# Patient Record
Sex: Female | Born: 1995 | Race: White | Hispanic: Yes | Marital: Single | Smoking: Never smoker
Health system: Southern US, Community
[De-identification: ages and names within clinical notes are randomized; demographics above are authoritative.]

## PROBLEM LIST (undated history)

## (undated) DIAGNOSIS — F419 Anxiety disorder, unspecified: Secondary | ICD-10-CM

## (undated) DIAGNOSIS — I739 Peripheral vascular disease, unspecified: Secondary | ICD-10-CM

## (undated) DIAGNOSIS — T8859XA Other complications of anesthesia, initial encounter: Secondary | ICD-10-CM

## (undated) DIAGNOSIS — F32A Depression, unspecified: Secondary | ICD-10-CM

## (undated) DIAGNOSIS — F329 Major depressive disorder, single episode, unspecified: Secondary | ICD-10-CM

## (undated) HISTORY — PX: CARDIAC CATHETERIZATION: SHX172

## (undated) HISTORY — PX: APPENDECTOMY: SHX54

## (undated) HISTORY — DX: Peripheral vascular disease, unspecified: I73.9

---

## 2015-07-11 ENCOUNTER — Other Ambulatory Visit: Payer: Self-pay | Admitting: Orthopedic Surgery

## 2015-07-11 DIAGNOSIS — M5432 Sciatica, left side: Principal | ICD-10-CM

## 2015-07-11 DIAGNOSIS — M5431 Sciatica, right side: Secondary | ICD-10-CM

## 2015-07-19 ENCOUNTER — Other Ambulatory Visit: Payer: Self-pay

## 2015-07-21 ENCOUNTER — Ambulatory Visit
Admission: RE | Admit: 2015-07-21 | Discharge: 2015-07-21 | Disposition: A | Discharge status: Home / Self Care | Payer: BLUE CROSS/BLUE SHIELD | Source: Ambulatory Visit | Attending: Orthopedic Surgery | Admitting: Orthopedic Surgery

## 2015-07-21 DIAGNOSIS — M5431 Sciatica, right side: Secondary | ICD-10-CM

## 2015-07-21 DIAGNOSIS — M5432 Sciatica, left side: Principal | ICD-10-CM

## 2015-08-26 DIAGNOSIS — S233XXA Sprain of ligaments of thoracic spine, initial encounter: Secondary | ICD-10-CM | POA: Diagnosis not present

## 2015-10-20 DIAGNOSIS — M545 Low back pain: Secondary | ICD-10-CM | POA: Diagnosis not present

## 2015-10-20 DIAGNOSIS — G479 Sleep disorder, unspecified: Secondary | ICD-10-CM | POA: Diagnosis not present

## 2015-10-26 DIAGNOSIS — G479 Sleep disorder, unspecified: Secondary | ICD-10-CM | POA: Diagnosis not present

## 2015-10-26 DIAGNOSIS — M545 Low back pain: Secondary | ICD-10-CM | POA: Diagnosis not present

## 2015-11-02 DIAGNOSIS — Z0000 Encounter for general adult medical examination without abnormal findings: Secondary | ICD-10-CM | POA: Diagnosis not present

## 2015-11-04 DIAGNOSIS — M545 Low back pain: Secondary | ICD-10-CM | POA: Diagnosis not present

## 2015-11-04 DIAGNOSIS — G479 Sleep disorder, unspecified: Secondary | ICD-10-CM | POA: Diagnosis not present

## 2015-11-09 DIAGNOSIS — M545 Low back pain: Secondary | ICD-10-CM | POA: Diagnosis not present

## 2015-11-09 DIAGNOSIS — G479 Sleep disorder, unspecified: Secondary | ICD-10-CM | POA: Diagnosis not present

## 2015-11-16 DIAGNOSIS — M545 Low back pain: Secondary | ICD-10-CM | POA: Diagnosis not present

## 2015-11-16 DIAGNOSIS — G479 Sleep disorder, unspecified: Secondary | ICD-10-CM | POA: Diagnosis not present

## 2015-11-22 DIAGNOSIS — G479 Sleep disorder, unspecified: Secondary | ICD-10-CM | POA: Diagnosis not present

## 2015-11-22 DIAGNOSIS — M545 Low back pain: Secondary | ICD-10-CM | POA: Diagnosis not present

## 2015-11-30 DIAGNOSIS — M545 Low back pain: Secondary | ICD-10-CM | POA: Diagnosis not present

## 2015-11-30 DIAGNOSIS — G479 Sleep disorder, unspecified: Secondary | ICD-10-CM | POA: Diagnosis not present

## 2015-12-05 DIAGNOSIS — G479 Sleep disorder, unspecified: Secondary | ICD-10-CM | POA: Diagnosis not present

## 2015-12-05 DIAGNOSIS — M545 Low back pain: Secondary | ICD-10-CM | POA: Diagnosis not present

## 2015-12-15 DIAGNOSIS — G479 Sleep disorder, unspecified: Secondary | ICD-10-CM | POA: Diagnosis not present

## 2015-12-15 DIAGNOSIS — M545 Low back pain: Secondary | ICD-10-CM | POA: Diagnosis not present

## 2015-12-29 DIAGNOSIS — M545 Low back pain: Secondary | ICD-10-CM | POA: Diagnosis not present

## 2015-12-29 DIAGNOSIS — G479 Sleep disorder, unspecified: Secondary | ICD-10-CM | POA: Diagnosis not present

## 2016-01-12 DIAGNOSIS — M545 Low back pain: Secondary | ICD-10-CM | POA: Diagnosis not present

## 2016-01-12 DIAGNOSIS — G479 Sleep disorder, unspecified: Secondary | ICD-10-CM | POA: Diagnosis not present

## 2016-01-26 DIAGNOSIS — M545 Low back pain: Secondary | ICD-10-CM | POA: Diagnosis not present

## 2016-01-26 DIAGNOSIS — G479 Sleep disorder, unspecified: Secondary | ICD-10-CM | POA: Diagnosis not present

## 2016-02-23 DIAGNOSIS — Z23 Encounter for immunization: Secondary | ICD-10-CM | POA: Diagnosis not present

## 2016-02-23 DIAGNOSIS — M549 Dorsalgia, unspecified: Secondary | ICD-10-CM | POA: Diagnosis not present

## 2016-02-29 DIAGNOSIS — M546 Pain in thoracic spine: Secondary | ICD-10-CM | POA: Diagnosis not present

## 2016-02-29 DIAGNOSIS — G8929 Other chronic pain: Secondary | ICD-10-CM | POA: Diagnosis not present

## 2016-05-01 DIAGNOSIS — F439 Reaction to severe stress, unspecified: Secondary | ICD-10-CM | POA: Diagnosis not present

## 2016-05-01 DIAGNOSIS — F430 Acute stress reaction: Secondary | ICD-10-CM | POA: Diagnosis not present

## 2016-05-01 DIAGNOSIS — F332 Major depressive disorder, recurrent severe without psychotic features: Secondary | ICD-10-CM | POA: Diagnosis not present

## 2016-05-08 DIAGNOSIS — F411 Generalized anxiety disorder: Secondary | ICD-10-CM | POA: Diagnosis not present

## 2016-05-08 DIAGNOSIS — G894 Chronic pain syndrome: Secondary | ICD-10-CM | POA: Diagnosis not present

## 2016-05-08 DIAGNOSIS — F332 Major depressive disorder, recurrent severe without psychotic features: Secondary | ICD-10-CM | POA: Diagnosis not present

## 2016-05-08 DIAGNOSIS — F4310 Post-traumatic stress disorder, unspecified: Secondary | ICD-10-CM | POA: Diagnosis not present

## 2016-05-15 DIAGNOSIS — F411 Generalized anxiety disorder: Secondary | ICD-10-CM | POA: Diagnosis not present

## 2016-05-15 DIAGNOSIS — F4310 Post-traumatic stress disorder, unspecified: Secondary | ICD-10-CM | POA: Diagnosis not present

## 2016-05-15 DIAGNOSIS — F332 Major depressive disorder, recurrent severe without psychotic features: Secondary | ICD-10-CM | POA: Diagnosis not present

## 2016-05-15 DIAGNOSIS — G894 Chronic pain syndrome: Secondary | ICD-10-CM | POA: Diagnosis not present

## 2016-05-18 DIAGNOSIS — G894 Chronic pain syndrome: Secondary | ICD-10-CM | POA: Diagnosis not present

## 2016-05-18 DIAGNOSIS — F411 Generalized anxiety disorder: Secondary | ICD-10-CM | POA: Diagnosis not present

## 2016-05-18 DIAGNOSIS — R112 Nausea with vomiting, unspecified: Secondary | ICD-10-CM | POA: Diagnosis not present

## 2016-05-18 DIAGNOSIS — F331 Major depressive disorder, recurrent, moderate: Secondary | ICD-10-CM | POA: Diagnosis not present

## 2016-05-18 DIAGNOSIS — R109 Unspecified abdominal pain: Secondary | ICD-10-CM | POA: Diagnosis not present

## 2016-05-19 ENCOUNTER — Encounter (HOSPITAL_COMMUNITY): Payer: Self-pay | Admitting: Emergency Medicine

## 2016-05-19 ENCOUNTER — Encounter (HOSPITAL_COMMUNITY): Admission: EM | Disposition: A | Discharge status: Home / Self Care | Payer: Self-pay | Source: Home / Self Care

## 2016-05-19 ENCOUNTER — Emergency Department (HOSPITAL_COMMUNITY): Payer: BLUE CROSS/BLUE SHIELD | Admitting: Anesthesiology

## 2016-05-19 ENCOUNTER — Observation Stay (HOSPITAL_COMMUNITY)
Admission: EM | Admit: 2016-05-19 | Discharge: 2016-05-21 | Disposition: A | Discharge status: Home / Self Care | Payer: BLUE CROSS/BLUE SHIELD | Attending: Surgery | Admitting: Surgery

## 2016-05-19 ENCOUNTER — Emergency Department (HOSPITAL_COMMUNITY): Payer: BLUE CROSS/BLUE SHIELD

## 2016-05-19 DIAGNOSIS — R1011 Right upper quadrant pain: Secondary | ICD-10-CM | POA: Diagnosis not present

## 2016-05-19 DIAGNOSIS — F419 Anxiety disorder, unspecified: Secondary | ICD-10-CM | POA: Diagnosis not present

## 2016-05-19 DIAGNOSIS — K353 Acute appendicitis with localized peritonitis: Secondary | ICD-10-CM | POA: Diagnosis not present

## 2016-05-19 DIAGNOSIS — F329 Major depressive disorder, single episode, unspecified: Secondary | ICD-10-CM | POA: Diagnosis not present

## 2016-05-19 DIAGNOSIS — R109 Unspecified abdominal pain: Secondary | ICD-10-CM | POA: Diagnosis not present

## 2016-05-19 DIAGNOSIS — K3580 Unspecified acute appendicitis: Secondary | ICD-10-CM | POA: Diagnosis not present

## 2016-05-19 HISTORY — DX: Depression, unspecified: F32.A

## 2016-05-19 HISTORY — PX: LAPAROSCOPIC APPENDECTOMY: SHX408

## 2016-05-19 HISTORY — DX: Major depressive disorder, single episode, unspecified: F32.9

## 2016-05-19 HISTORY — DX: Anxiety disorder, unspecified: F41.9

## 2016-05-19 LAB — COMPREHENSIVE METABOLIC PANEL
ALT: 12 U/L — ABNORMAL LOW (ref 14–54)
AST: 26 U/L (ref 15–41)
Albumin: 4.4 g/dL (ref 3.5–5.0)
Alkaline Phosphatase: 48 U/L (ref 38–126)
Anion gap: 11 (ref 5–15)
BUN: 10 mg/dL (ref 6–20)
CO2: 21 mmol/L — ABNORMAL LOW (ref 22–32)
Calcium: 9.3 mg/dL (ref 8.9–10.3)
Chloride: 102 mmol/L (ref 101–111)
Creatinine, Ser: 0.7 mg/dL (ref 0.44–1.00)
GFR calc Af Amer: >60 mL/min (ref >60)
GFR calc non Af Amer: >60 mL/min (ref >60)
Glucose, Bld: 177 mg/dL — ABNORMAL HIGH (ref 65–99)
Potassium: 3.7 mmol/L (ref 3.5–5.1)
Sodium: 134 mmol/L — ABNORMAL LOW (ref 135–145)
Total Bilirubin: 1 mg/dL (ref 0.3–1.2)
Total Protein: 7.2 g/dL (ref 6.5–8.1)

## 2016-05-19 LAB — CBC
HCT: 38.6 % (ref 36.0–46.0)
Hemoglobin: 12.9 g/dL (ref 12.0–15.0)
MCH: 28.5 pg (ref 26.0–34.0)
MCHC: 33.4 g/dL (ref 30.0–36.0)
MCV: 85.4 fL (ref 78.0–100.0)
Platelets: 247 10*3/uL (ref 150–400)
RBC: 4.52 MIL/uL (ref 3.87–5.11)
RDW: 12.8 % (ref 11.5–15.5)
WBC: 20.3 10*3/uL — ABNORMAL HIGH (ref 4.0–10.5)

## 2016-05-19 LAB — URINALYSIS, ROUTINE W REFLEX MICROSCOPIC
Bilirubin Urine: NEGATIVE
Glucose, UA: 50 mg/dL — AB
Hgb urine dipstick: NEGATIVE
Ketones, ur: 80 mg/dL — AB
Nitrite: NEGATIVE
Protein, ur: 30 mg/dL — AB
Specific Gravity, Urine: 1.028 (ref 1.005–1.030)
pH: 6 (ref 5.0–8.0)

## 2016-05-19 LAB — I-STAT BETA HCG BLOOD, ED (MC, WL, AP ONLY): I-stat hCG, quantitative: <5 m[IU]/mL (ref <5)

## 2016-05-19 LAB — LIPASE, BLOOD: Lipase: 21 U/L (ref 11–51)

## 2016-05-19 SURGERY — APPENDECTOMY, LAPAROSCOPIC
Anesthesia: General | Site: Abdomen

## 2016-05-19 MED ORDER — ONDANSETRON HCL 4 MG/2ML IJ SOLN
INTRAMUSCULAR | Status: DC | PRN
  Administered 2016-05-19: 4 mg via INTRAVENOUS

## 2016-05-19 MED ORDER — KCL IN DEXTROSE-NACL 20-5-0.9 MEQ/L-%-% IV SOLN
INTRAVENOUS | Status: DC
  Administered 2016-05-19 – 2016-05-20 (×2): via INTRAVENOUS
  Filled 2016-05-19 (×4): qty 1000

## 2016-05-19 MED ORDER — PIPERACILLIN-TAZOBACTAM 3.375 G IVPB: 3.3750 g | Freq: Three times a day (TID) | INTRAVENOUS | Status: DC

## 2016-05-19 MED ORDER — METHOCARBAMOL 500 MG PO TABS
500.0000 mg | ORAL_TABLET | Freq: Four times a day (QID) | ORAL | Status: DC | PRN
  Administered 2016-05-20 – 2016-05-21 (×4): 500 mg via ORAL
  Filled 2016-05-19 (×4): qty 1

## 2016-05-19 MED ORDER — 0.9 % SODIUM CHLORIDE (POUR BTL) OPTIME
TOPICAL | Status: DC | PRN
  Administered 2016-05-19: 1000 mL

## 2016-05-19 MED ORDER — SIMETHICONE 80 MG PO CHEW: 40.0000 mg | CHEWABLE_TABLET | Freq: Four times a day (QID) | ORAL | Status: DC | PRN

## 2016-05-19 MED ORDER — LABETALOL HCL 5 MG/ML IV SOLN
INTRAVENOUS | Status: AC
  Filled 2016-05-19: qty 4

## 2016-05-19 MED ORDER — SUCCINYLCHOLINE CHLORIDE 20 MG/ML IJ SOLN
INTRAMUSCULAR | Status: DC | PRN
  Administered 2016-05-19: 100 mg via INTRAVENOUS

## 2016-05-19 MED ORDER — BUPIVACAINE HCL (PF) 0.25 % IJ SOLN
INTRAMUSCULAR | Status: DC | PRN
  Administered 2016-05-19: 5 mL

## 2016-05-19 MED ORDER — LACTATED RINGERS IV SOLN
INTRAVENOUS | Status: DC | PRN
  Administered 2016-05-19 (×2): via INTRAVENOUS

## 2016-05-19 MED ORDER — LIDOCAINE HCL (CARDIAC) 20 MG/ML IV SOLN
INTRAVENOUS | Status: DC | PRN
  Administered 2016-05-19: 60 mg via INTRATRACHEAL

## 2016-05-19 MED ORDER — PROPOFOL 10 MG/ML IV BOLUS
INTRAVENOUS | Status: AC
  Filled 2016-05-19: qty 40

## 2016-05-19 MED ORDER — KCL IN DEXTROSE-NACL 20-5-0.9 MEQ/L-%-% IV SOLN: INTRAVENOUS | Status: DC

## 2016-05-19 MED ORDER — GABAPENTIN 300 MG PO CAPS
300.0000 mg | ORAL_CAPSULE | Freq: Three times a day (TID) | ORAL | Status: DC
  Administered 2016-05-19 – 2016-05-21 (×5): 300 mg via ORAL
  Filled 2016-05-19 (×5): qty 1

## 2016-05-19 MED ORDER — MORPHINE SULFATE (PF) 4 MG/ML IV SOLN
6.0000 mg | Freq: Once | INTRAVENOUS | Status: AC
  Administered 2016-05-19: 6 mg via INTRAVENOUS
  Filled 2016-05-19: qty 2

## 2016-05-19 MED ORDER — PROMETHAZINE HCL 25 MG/ML IJ SOLN: 6.2500 mg | INTRAMUSCULAR | Status: DC | PRN

## 2016-05-19 MED ORDER — ONDANSETRON HCL 4 MG/2ML IJ SOLN: 4.0000 mg | Freq: Four times a day (QID) | INTRAMUSCULAR | Status: DC | PRN

## 2016-05-19 MED ORDER — MEPERIDINE HCL 25 MG/ML IJ SOLN: 6.2500 mg | INTRAMUSCULAR | Status: DC | PRN

## 2016-05-19 MED ORDER — LACTATED RINGERS IV SOLN: INTRAVENOUS | Status: DC

## 2016-05-19 MED ORDER — MIRTAZAPINE 7.5 MG PO TABS
7.5000 mg | ORAL_TABLET | Freq: Every day | ORAL | Status: DC
  Administered 2016-05-19 – 2016-05-20 (×2): 7.5 mg via ORAL
  Filled 2016-05-19 (×3): qty 1

## 2016-05-19 MED ORDER — CHLORHEXIDINE GLUCONATE CLOTH 2 % EX PADS: 6.0000 | MEDICATED_PAD | Freq: Once | CUTANEOUS | Status: DC

## 2016-05-19 MED ORDER — ZOLPIDEM TARTRATE 5 MG PO TABS: 5.0000 mg | ORAL_TABLET | Freq: Every evening | ORAL | Status: DC | PRN

## 2016-05-19 MED ORDER — HYDROMORPHONE HCL 1 MG/ML IJ SOLN
0.2500 mg | INTRAMUSCULAR | Status: DC | PRN
  Administered 2016-05-19: 0.5 mg via INTRAVENOUS

## 2016-05-19 MED ORDER — FENTANYL CITRATE (PF) 250 MCG/5ML IJ SOLN
INTRAMUSCULAR | Status: DC | PRN
  Administered 2016-05-19: 50 ug via INTRAVENOUS
  Administered 2016-05-19: 100 ug via INTRAVENOUS
  Administered 2016-05-19: 50 ug via INTRAVENOUS

## 2016-05-19 MED ORDER — PROPOFOL 10 MG/ML IV BOLUS
INTRAVENOUS | Status: DC | PRN
Start: 2016-05-19 — End: 2016-05-19
  Administered 2016-05-19: 150 mg via INTRAVENOUS

## 2016-05-19 MED ORDER — MORPHINE SULFATE (PF) 2 MG/ML IV SOLN: 1.0000 mg | INTRAVENOUS | Status: DC | PRN

## 2016-05-19 MED ORDER — ONDANSETRON 4 MG PO TBDP: 4.0000 mg | ORAL_TABLET | Freq: Four times a day (QID) | ORAL | Status: DC | PRN

## 2016-05-19 MED ORDER — CEFAZOLIN SODIUM 1 G IJ SOLR
INTRAMUSCULAR | Status: AC
  Filled 2016-05-19: qty 20

## 2016-05-19 MED ORDER — ONDANSETRON HCL 4 MG/2ML IJ SOLN: 4.0000 mg | Freq: Once | INTRAMUSCULAR | Status: DC

## 2016-05-19 MED ORDER — ONDANSETRON HCL 4 MG/2ML IJ SOLN
4.0000 mg | Freq: Once | INTRAMUSCULAR | Status: AC
  Administered 2016-05-19: 4 mg via INTRAVENOUS
  Filled 2016-05-19: qty 2

## 2016-05-19 MED ORDER — SODIUM CHLORIDE 0.9 % IR SOLN
Status: DC | PRN
  Administered 2016-05-19: 1000 mL

## 2016-05-19 MED ORDER — ROCURONIUM BROMIDE 100 MG/10ML IV SOLN
INTRAVENOUS | Status: DC | PRN
  Administered 2016-05-19: 30 mg via INTRAVENOUS

## 2016-05-19 MED ORDER — MIDAZOLAM HCL 5 MG/5ML IJ SOLN
INTRAMUSCULAR | Status: DC | PRN
Start: 2016-05-19 — End: 2016-05-19
  Administered 2016-05-19: 2 mg via INTRAVENOUS

## 2016-05-19 MED ORDER — OXYCODONE HCL 5 MG PO TABS: 5.0000 mg | ORAL_TABLET | ORAL | Status: DC | PRN

## 2016-05-19 MED ORDER — GLYCOPYRROLATE 0.2 MG/ML IJ SOLN
INTRAMUSCULAR | Status: DC | PRN
  Administered 2016-05-19: .6 mg via INTRAVENOUS

## 2016-05-19 MED ORDER — ENOXAPARIN SODIUM 40 MG/0.4ML ~~LOC~~ SOLN: 40.0000 mg | SUBCUTANEOUS | Status: DC

## 2016-05-19 MED ORDER — CEFAZOLIN SODIUM-DEXTROSE 2-3 GM-% IV SOLR
INTRAVENOUS | Status: DC | PRN
  Administered 2016-05-19: 2 g via INTRAVENOUS

## 2016-05-19 MED ORDER — LACTATED RINGERS IV SOLN
INTRAVENOUS | Status: DC
Start: 2016-05-19 — End: 2016-05-19
  Administered 2016-05-19: 18:00:00 via INTRAVENOUS

## 2016-05-19 MED ORDER — SODIUM CHLORIDE 0.9 % IV BOLUS (SEPSIS)
1000.0000 mL | Freq: Once | INTRAVENOUS | Status: AC
  Administered 2016-05-19: 1000 mL via INTRAVENOUS

## 2016-05-19 MED ORDER — MORPHINE SULFATE (PF) 2 MG/ML IV SOLN
1.0000 mg | INTRAVENOUS | Status: DC | PRN
  Administered 2016-05-20: 1 mg via INTRAVENOUS
  Filled 2016-05-19: qty 1

## 2016-05-19 MED ORDER — BUPIVACAINE HCL (PF) 0.25 % IJ SOLN
INTRAMUSCULAR | Status: AC
  Filled 2016-05-19: qty 30

## 2016-05-19 MED ORDER — MENTHOL 3 MG MT LOZG
1.0000 | LOZENGE | OROMUCOSAL | Status: DC | PRN
  Filled 2016-05-19: qty 9

## 2016-05-19 MED ORDER — MIDAZOLAM HCL 2 MG/2ML IJ SOLN
INTRAMUSCULAR | Status: AC
  Filled 2016-05-19: qty 2

## 2016-05-19 MED ORDER — CEFTRIAXONE SODIUM 1 G IJ SOLR
1.0000 g | Freq: Once | INTRAMUSCULAR | Status: AC
  Administered 2016-05-19: 1 g via INTRAVENOUS
  Filled 2016-05-19: qty 10

## 2016-05-19 MED ORDER — DEXTROSE 5 % IV SOLN
3.0000 g | INTRAVENOUS | Status: DC
  Filled 2016-05-19: qty 3000

## 2016-05-19 MED ORDER — FENTANYL CITRATE (PF) 100 MCG/2ML IJ SOLN
INTRAMUSCULAR | Status: AC
  Filled 2016-05-19: qty 4

## 2016-05-19 MED ORDER — NEOSTIGMINE METHYLSULFATE 10 MG/10ML IV SOLN
INTRAVENOUS | Status: DC | PRN
  Administered 2016-05-19: 3 mg via INTRAVENOUS

## 2016-05-19 MED ORDER — PHENOL 1.4 % MT LIQD
1.0000 | OROMUCOSAL | Status: DC | PRN
  Filled 2016-05-19: qty 177

## 2016-05-19 MED ORDER — HYDROMORPHONE HCL 1 MG/ML IJ SOLN
INTRAMUSCULAR | Status: AC
  Filled 2016-05-19: qty 0.5

## 2016-05-19 MED ORDER — OXYCODONE HCL 5 MG PO TABS
5.0000 mg | ORAL_TABLET | ORAL | Status: DC | PRN
  Administered 2016-05-20 (×4): 10 mg via ORAL
  Administered 2016-05-21: 5 mg via ORAL
  Filled 2016-05-19 (×2): qty 2
  Filled 2016-05-19: qty 1
  Filled 2016-05-19 (×2): qty 2

## 2016-05-19 MED ORDER — ESMOLOL HCL 100 MG/10ML IV SOLN
INTRAVENOUS | Status: AC
  Filled 2016-05-19: qty 10

## 2016-05-19 MED ORDER — IOPAMIDOL (ISOVUE-300) INJECTION 61%
INTRAVENOUS | Status: AC
  Administered 2016-05-19: 80 mL
  Filled 2016-05-19: qty 100

## 2016-05-19 SURGICAL SUPPLY — 41 items
APPLIER CLIP ROT 10 11.4 M/L (STAPLE)
BLADE SURG ROTATE 9660 (MISCELLANEOUS) ×2 IMPLANT
CANISTER SUCTION 2500CC (MISCELLANEOUS) ×2 IMPLANT
CHLORAPREP W/TINT 26ML (MISCELLANEOUS) ×2 IMPLANT
CLIP APPLIE ROT 10 11.4 M/L (STAPLE) IMPLANT
COVER SURGICAL LIGHT HANDLE (MISCELLANEOUS) ×2 IMPLANT
CUTTER FLEX LINEAR 45M (STAPLE) ×2 IMPLANT
DERMABOND ADHESIVE PROPEN (GAUZE/BANDAGES/DRESSINGS) ×1
DERMABOND ADVANCED (GAUZE/BANDAGES/DRESSINGS) ×1
DERMABOND ADVANCED .7 DNX12 (GAUZE/BANDAGES/DRESSINGS) ×1 IMPLANT
DERMABOND ADVANCED .7 DNX6 (GAUZE/BANDAGES/DRESSINGS) ×1 IMPLANT
DRAPE WARM FLUID 44X44 (DRAPE) ×2 IMPLANT
ELECT REM PT RETURN 9FT ADLT (ELECTROSURGICAL) ×2
ELECTRODE REM PT RTRN 9FT ADLT (ELECTROSURGICAL) ×1 IMPLANT
ENDOLOOP SUT PDS II  0 18 (SUTURE)
ENDOLOOP SUT PDS II 0 18 (SUTURE) IMPLANT
GLOVE BIO SURGEON STRL SZ8 (GLOVE) ×2 IMPLANT
GLOVE BIOGEL PI IND STRL 8 (GLOVE) ×1 IMPLANT
GLOVE BIOGEL PI INDICATOR 8 (GLOVE) ×1
GOWN STRL REUS W/ TWL LRG LVL3 (GOWN DISPOSABLE) ×2 IMPLANT
GOWN STRL REUS W/ TWL XL LVL3 (GOWN DISPOSABLE) ×1 IMPLANT
GOWN STRL REUS W/TWL LRG LVL3 (GOWN DISPOSABLE) ×2
GOWN STRL REUS W/TWL XL LVL3 (GOWN DISPOSABLE) ×1
KIT BASIN OR (CUSTOM PROCEDURE TRAY) ×2 IMPLANT
KIT ROOM TURNOVER OR (KITS) ×2 IMPLANT
NS IRRIG 1000ML POUR BTL (IV SOLUTION) ×2 IMPLANT
PAD ARMBOARD 7.5X6 YLW CONV (MISCELLANEOUS) ×4 IMPLANT
POUCH SPECIMEN RETRIEVAL 10MM (ENDOMECHANICALS) ×2 IMPLANT
RELOAD STAPLE TA45 3.5 REG BLU (ENDOMECHANICALS) ×2 IMPLANT
SCALPEL HARMONIC ACE (MISCELLANEOUS) ×2 IMPLANT
SCISSORS LAP 5X35 DISP (ENDOMECHANICALS) ×2 IMPLANT
SET IRRIG TUBING LAPAROSCOPIC (IRRIGATION / IRRIGATOR) ×2 IMPLANT
SPECIMEN JAR SMALL (MISCELLANEOUS) ×2 IMPLANT
SUT MON AB 4-0 PC3 18 (SUTURE) ×2 IMPLANT
TOWEL OR 17X24 6PK STRL BLUE (TOWEL DISPOSABLE) ×2 IMPLANT
TOWEL OR 17X26 10 PK STRL BLUE (TOWEL DISPOSABLE) ×2 IMPLANT
TRAY FOLEY BAG SILVER LF 14FR (SET/KITS/TRAYS/PACK) ×2 IMPLANT
TRAY LAPAROSCOPIC MC (CUSTOM PROCEDURE TRAY) ×2 IMPLANT
TROCAR XCEL BLADELESS 5X75MML (TROCAR) ×6 IMPLANT
TROCAR XCEL BLUNT TIP 100MML (ENDOMECHANICALS) ×4 IMPLANT
TUBING INSUFFLATION (TUBING) ×2 IMPLANT

## 2016-05-19 NOTE — ED Notes (Signed)
Out of bed to bathroom with steady gait.

## 2016-05-19 NOTE — Anesthesia Preprocedure Evaluation (Addendum)
Anesthesia Evaluation  Patient identified by MRN, date of birth, ID band Patient awake    Reviewed: Allergy & Precautions, NPO status , Patient's Chart, lab work & pertinent test results  Airway Mallampati: I  TM Distance: <3 FB Neck ROM: Full    Dental  (+) Teeth Intact, Dental Advisory Given   Pulmonary    breath sounds clear to auscultation       Cardiovascular negative cardio ROS   Rhythm:Regular Rate:Normal     Neuro/Psych PSYCHIATRIC DISORDERS Anxiety Depression negative neurological ROS     GI/Hepatic negative GI ROS, Neg liver ROS,   Endo/Other  negative endocrine ROS  Renal/GU negative Renal ROS  negative genitourinary   Musculoskeletal negative musculoskeletal ROS (+)   Abdominal   Peds negative pediatric ROS (+)  Hematology negative hematology ROS (+)   Anesthesia Other Findings   Reproductive/Obstetrics negative OB ROS                           Anesthesia Physical Anesthesia Plan  ASA: II and emergent  Anesthesia Plan: General   Post-op Pain Management:    Induction: Intravenous  Airway Management Planned: Oral ETT  Additional Equipment:   Intra-op Plan:   Post-operative Plan: Extubation in OR  Informed Consent: I have reviewed the patients History and Physical, chart, labs and discussed the procedure including the risks, benefits and alternatives for the proposed anesthesia with the patient or authorized representative who has indicated his/her understanding and acceptance.   Dental advisory given  Plan Discussed with: CRNA, Anesthesiologist and Surgeon  Anesthesia Plan Comments:        Anesthesia Quick Evaluation

## 2016-05-19 NOTE — Interval H&P Note (Signed)
History and Physical Interval Note:  05/19/2016 6:21 PM  Candice BeetsLizbeth Fiero  has presented today for surgery, with the diagnosis of acute appendicitis  The various methods of treatment have been discussed with the patient and family. After consideration of risks, benefits and other options for treatment, the patient has consented to  Procedure(s): APPENDECTOMY LAPAROSCOPIC (N/A) as a surgical intervention .  The patient's history has been reviewed, patient examined, no change in status, stable for surgery.  I have reviewed the patient's chart and labs.  Questions were answered to the patient's satisfaction.     Derry Arbogast A.

## 2016-05-19 NOTE — Op Note (Signed)
Appendectomy, Lap, Procedure Note  Indications: The patient presented with a history of right-sided abdominal pain. A CT  revealed findings consistent with acute appendicitis.The procedure has been discussed with the patient.  Alternative therapies have been discussed with the patient.  Operative risks include bleeding,  Infection,  Organ injury,  Nerve injury,  Blood vessel injury,  DVT,  Pulmonary embolism, abscess open surgery  Death,  And possible reoperation.  Medical management risks include worsening of present situation.  The success of the procedure is 50 -95 % at treating patients symptoms.  The patient understands and agrees to proceed.  Pre-operative Diagnosis: Acute appendicitis without mention of peritonitis  Post-operative Diagnosis: Same  Surgeon: Gaylon Bentz A.   Assistants: none  Anesthesia: General endotracheal anesthesia and Local anesthesia 0.25.% bupivacaine, with epinephrine  ASA Class: 1  Procedure Details  The patient was seen again in the Holding Room. The risks, benefits, complications, treatment options, and expected outcomes were discussed with the patient and/or family. The possibilities of reaction to medication, pulmonary aspiration, perforation of viscus, bleeding, recurrent infection, finding a normal appendix, the need for additional procedures, failure to diagnose a condition, and creating a complication requiring transfusion or operation were discussed. There was concurrence with the proposed plan and informed consent was obtained. The site of surgery was properly noted/marked. The patient was taken to Operating Room, identified as Candice Cruz and the procedure verified as Appendectomy. A Time Out was held and the above information confirmed.  The patient was placed in the supine position and general anesthesia was induced, along with placement of orogastric tube, Venodyne boots, and a Foley catheter. The abdomen was prepped and draped in a sterile  fashion. A one centimeter infraumbilical incision was made and the peritoneal cavity was accessed using the OPEN  technique. The pneumoperitoneum was then established to steady pressure of 12 mmHg. A 12 mm port was placed through the umbilical incision. Additional 5 mm cannulas then placed in the lower midline of the abdomen and half way between the umbilicus and xyphoid under direct vision. A careful evaluation of the entire abdomen was carried out. The patient was placed in Trendelenburg and left lateral decubitus position. The small intestines were retracted in the cephalad and left lateral direction away from the pelvis and right lower quadrant. The patient was found to have an enlarged and inflamed appendix that was extending into the pelvis. There was no evidence of perforation.  The appendix was carefully dissected. A window was made in the mesoappendix at the base of the appendix. A harmonic scalpel was used across the mesoappendix. The appendix was divided at its base using an endo-GIA stapler. Minimal appendiceal stump was left in place. There was no evidence of bleeding, leakage, or complication after division of the appendix. Irrigation was also performed and irrigate suctioned from the abdomen as well.  The umbilical port site was closed using 0 vicryl pursestring sutures fashion at the level of the fascia. The trocar site skin wounds were closed using skin staples.  Instrument, sponge, and needle counts were correct at the conclusion of the case.   Findings: The appendix was found to be inflamed. There were not signs of necrosis.  There was not perforation. There was not abscess formation.  Estimated Blood Loss:  Minimal         Drains: none         Total IV Fluids: 800 mL         Specimens: appendix  Complications:  None; patient tolerated the procedure well.         Disposition: PACU - hemodynamically stable.         Condition: stable

## 2016-05-19 NOTE — Progress Notes (Signed)
Report given to Rebecca, CRNA 

## 2016-05-19 NOTE — ED Notes (Signed)
Admitting MD at bedside.

## 2016-05-19 NOTE — Transfer of Care (Incomplete)
Immediate Anesthesia Transfer of Care Note  Patient: Candice BeetsLizbeth Peckenpaugh  Procedure(s) Performed: Procedure(s): APPENDECTOMY LAPAROSCOPIC (N/A)  Patient Location: {PLACES; ANE POST:19477::"PACU"}  Anesthesia Type:{PROCEDURES; ANE POST ANESTHESIA TYPE:19480}  Level of Consciousness: {FINDINGS; ANE POST LEVEL OF CONSCIOUSNESS:19484}  Airway & Oxygen Therapy: {Exam; oxygen device:30095}  Post-op Assessment: {ASSESSMENT;POST-OP ZOXWRU:04540}REPORT:18741}  Post vital signs: {DESC; ANE POST JWJXBJ:47829}VITALS:19483}  Last Vitals:  Vitals:   05/19/16 1730 05/19/16 1748  BP: 93/55 118/61  Pulse: 73 91  Resp:  16  Temp:  37 C    Last Pain:  Vitals:   05/19/16 1748  TempSrc: Oral  PainSc: 2          Complications: {FINDINGS; ANE POST COMPLICATIONS:19485}

## 2016-05-19 NOTE — H&P (Signed)
Candice Cruz is an 21 y.o. female.   Chief Complaint: abdominal pain HPI: asked to see patient in the ED for 1 day hx of RLQ abdominal pain.  Pain has worsened since yesterday and is located RLQ abdomen.  It is sharp and hurts more when she moves.  CT shows acute appendicitis.    Past Medical History:  Diagnosis Date  . Anxiety   . Depression     History reviewed. No pertinent surgical history.  No family history on file. Social History:  reports that she has never smoked. She has never used smokeless tobacco. She reports that she does not drink alcohol or use drugs.  Allergies:  Allergies  Allergen Reactions  . Cymbalta [Duloxetine Hcl]   . Duloxetine Other (See Comments)    Panic attacks     (Not in a hospital admission)  Results for orders placed or performed during the hospital encounter of 05/19/16 (from the past 48 hour(s))  Urinalysis, Routine w reflex microscopic     Status: Abnormal   Collection Time: 05/19/16  1:21 AM  Result Value Ref Range   Color, Urine YELLOW YELLOW   APPearance HAZY (A) CLEAR   Specific Gravity, Urine 1.028 1.005 - 1.030   pH 6.0 5.0 - 8.0   Glucose, UA 50 (A) NEGATIVE mg/dL   Hgb urine dipstick NEGATIVE NEGATIVE   Bilirubin Urine NEGATIVE NEGATIVE   Ketones, ur 80 (A) NEGATIVE mg/dL   Protein, ur 30 (A) NEGATIVE mg/dL   Nitrite NEGATIVE NEGATIVE   Leukocytes, UA LARGE (A) NEGATIVE   RBC / HPF 0-5 0 - 5 RBC/hpf   WBC, UA 6-30 0 - 5 WBC/hpf   Bacteria, UA RARE (A) NONE SEEN   Squamous Epithelial / LPF 0-5 (A) NONE SEEN   Mucous PRESENT   Lipase, blood     Status: None   Collection Time: 05/19/16  1:22 AM  Result Value Ref Range   Lipase 21 11 - 51 U/L  Comprehensive metabolic panel     Status: Abnormal   Collection Time: 05/19/16  1:22 AM  Result Value Ref Range   Sodium 134 (L) 135 - 145 mmol/L   Potassium 3.7 3.5 - 5.1 mmol/L   Chloride 102 101 - 111 mmol/L   CO2 21 (L) 22 - 32 mmol/L   Glucose, Bld 177 (H) 65 - 99 mg/dL   BUN 10 6 - 20 mg/dL   Creatinine, Ser 0.70 0.44 - 1.00 mg/dL   Calcium 9.3 8.9 - 10.3 mg/dL   Total Protein 7.2 6.5 - 8.1 g/dL   Albumin 4.4 3.5 - 5.0 g/dL   AST 26 15 - 41 U/L   ALT 12 (L) 14 - 54 U/L   Alkaline Phosphatase 48 38 - 126 U/L   Total Bilirubin 1.0 0.3 - 1.2 mg/dL   GFR calc non Af Amer >60 >60 mL/min   GFR calc Af Amer >60 >60 mL/min    Comment: (NOTE) The eGFR has been calculated using the CKD EPI equation. This calculation has not been validated in all clinical situations. eGFR's persistently <60 mL/min signify possible Chronic Kidney Disease.    Anion gap 11 5 - 15  CBC     Status: Abnormal   Collection Time: 05/19/16  1:22 AM  Result Value Ref Range   WBC 20.3 (H) 4.0 - 10.5 K/uL   RBC 4.52 3.87 - 5.11 MIL/uL   Hemoglobin 12.9 12.0 - 15.0 g/dL   HCT 38.6 36.0 - 46.0 %  MCV 85.4 78.0 - 100.0 fL   MCH 28.5 26.0 - 34.0 pg   MCHC 33.4 30.0 - 36.0 g/dL   RDW 12.8 11.5 - 15.5 %   Platelets 247 150 - 400 K/uL  I-Stat beta hCG blood, ED     Status: None   Collection Time: 05/19/16  2:05 AM  Result Value Ref Range   I-stat hCG, quantitative <5.0 <5 mIU/mL   Comment 3            Comment:   GEST. AGE      CONC.  (mIU/mL)   <=1 WEEK        5 - 50     2 WEEKS       50 - 500     3 WEEKS       100 - 10,000     4 WEEKS     1,000 - 30,000        FEMALE AND NON-PREGNANT FEMALE:     LESS THAN 5 mIU/mL    US Abdomen Complete  Result Date: 05/19/2016 CLINICAL DATA:  Right upper quadrant pain. EXAM: ABDOMEN ULTRASOUND COMPLETE COMPARISON:  None. FINDINGS: Gallbladder: No gallstones or wall thickening visualized. No sonographic Murphy sign noted by sonographer. Common bile duct: Diameter: 2 mm Liver: No focal lesion identified. Within normal limits in parenchymal echogenicity. IVC: No abnormality visualized. Pancreas: Visualized portion unremarkable. Spleen: Size and appearance within normal limits. Right Kidney: Length: 10.6 cm. Echogenicity within normal limits. No mass or  hydronephrosis visualized. Left Kidney: Length: 10.7 cm. Echogenicity within normal limits. No mass or hydronephrosis visualized. Abdominal aorta: No aneurysm visualized. Other findings: None. IMPRESSION: 1. Normal abdominal sonogram. Electronically Signed   By: Kerby Moors M.D.   On: 05/19/2016 11:00   Ct Abdomen Pelvis W Contrast  Result Date: 05/19/2016 CLINICAL DATA:  Right-sided abdominal pain. Elevated white blood cell count. EXAM: CT ABDOMEN AND PELVIS WITH CONTRAST TECHNIQUE: Multidetector CT imaging of the abdomen and pelvis was performed using the standard protocol following bolus administration of intravenous contrast. CONTRAST:  27m ISOVUE-300 IOPAMIDOL (ISOVUE-300) INJECTION 61% COMPARISON:  None. FINDINGS: Lower chest: No acute abnormality. Hepatobiliary: No focal liver abnormality is seen. No gallstones, gallbladder wall thickening, or biliary dilatation. Pancreas: Unremarkable. No pancreatic ductal dilatation or surrounding inflammatory changes. Spleen: Normal in size without focal abnormality. Adrenals/Urinary Tract: Adrenal glands are unremarkable. Kidneys are normal, without renal calculi, focal lesion, or hydronephrosis. Bladder is unremarkable. Stomach/Bowel: Stomach is within normal limits. The appendix is visualized and appears abnormal measuring 11 mm in thickness and exhibits wall thickening measuring 5 mm in thickness, image 40 of series 203. No evidence of bowel wall thickening, distention, or inflammatory changes. No pathologic dilatation of the colon. Vascular/Lymphatic: Normal appearance of the abdominal aorta. No upper abdominal adenopathy. There is no pelvic or inguinal adenopathy. Reproductive: Uterus and bilateral adnexa are unremarkable. Other: Small amount of free fluid is identified within the pelvis. No discrete fluid collections identified. Musculoskeletal: No acute or significant osseous findings. IMPRESSION: 1. Findings are worrisome for acute appendicitis. No  evidence to suggest perforation or abscess. Electronically Signed   By: TKerby MoorsM.D.   On: 05/19/2016 12:51    Review of Systems  Constitutional: Negative for chills and fever.  HENT: Negative for hearing loss.   Respiratory: Positive for cough and hemoptysis.   Cardiovascular: Negative for chest pain.  Gastrointestinal: Positive for abdominal pain and nausea.  Genitourinary: Negative for dysuria.  Musculoskeletal: Negative.   Neurological: Negative.  Psychiatric/Behavioral: Negative.     Blood pressure 109/60, pulse 90, temperature 97.9 F (36.6 C), temperature source Oral, resp. rate 18, last menstrual period 04/28/2016, SpO2 99 %. Physical Exam  Constitutional: She is oriented to person, place, and time. She appears well-developed and well-nourished.  HENT:  Head: Normocephalic and atraumatic.  Eyes: EOM are normal. Pupils are equal, round, and reactive to light.  Neck: Normal range of motion. Neck supple.  Cardiovascular: Normal rate and regular rhythm.   Respiratory: Effort normal and breath sounds normal.  GI: There is tenderness in the right lower quadrant. There is tenderness at McBurney's point.  Musculoskeletal: Normal range of motion.  Neurological: She is alert and oriented to person, place, and time.  Skin: Skin is warm and dry.  Psychiatric: She has a normal mood and affect. Her behavior is normal. Judgment and thought content normal.     Assessment/Plan Acute appendicitis  Discussed laparoscopic appendectomy vs medical management.  She has opted for appendectomy.  The procedure has been discussed with the patient.  Alternative therapies have been discussed with the patient.  Operative risks include bleeding,  Infection,  Organ injury,  Nerve injury,  Blood vessel injury, bowel injury   DVT,  Pulmonary embolism,  Death,  And possible reoperation.  Medical management risks include worsening of present situation.  The success of the procedure is 50 -95 % at  treating patients symptoms.  The patient understands and agrees to proceed.  Sakoya Win A., MD 05/19/2016, 4:20 PM

## 2016-05-19 NOTE — ED Triage Notes (Signed)
Patient reports mid abdominal pain with emesis and diarrhea onset this week , denies fever or chills . She was seen at an urgent care today with no findings .

## 2016-05-19 NOTE — ED Notes (Signed)
Pt states she has been taking sips of water provided by mother. Pt states she understands she cannot have anything to eat or drink pending surgery.

## 2016-05-19 NOTE — Transfer of Care (Signed)
Immediate Anesthesia Transfer of Care Note  Patient: Candice Cruz  Procedure(s) Performed: Procedure(s): APPENDECTOMY LAPAROSCOPIC (N/A)  Patient Location: PACU  Anesthesia Type:General  Level of Consciousness: sedated  Airway & Oxygen Therapy: Patient Spontanous Breathing and Patient connected to face mask oxygen  Post-op Assessment: Report given to RN and Post -op Vital signs reviewed and stable  Post vital signs: Reviewed and stable  Last Vitals:  Vitals:   05/19/16 1730 05/19/16 1748  BP: 93/55 118/61  Pulse: 73 91  Resp:  16  Temp:  37 C    Last Pain:  Vitals:   05/19/16 1748  TempSrc: Oral  PainSc: 2          Complications: No apparent anesthesia complications

## 2016-05-19 NOTE — ED Provider Notes (Signed)
[facility]-EMERGENCY DEPT Provider Note   CSN: [id number] Arrival date & time: 05/19/16  0111     History   Chief Complaint Chief Complaint  Patient presents with  . Abdominal Pain    HPI Candice Cruz is a 21 y.o. female.  HPI Patient reports upper abdominal pain that began yesterday afternoon with onset of nausea and vomiting.  She states that she may have had some diarrhea earlier in the week.  No fevers or chills.  No dysuria or urinary frequency.  No back pain or flank pain.  No history kidney stones or gallstones.  She was seen in urgent care and sent to the ER for further evaluation for right upper quadrant pain.   Past Medical History:  Diagnosis Date  . Anxiety   . Depression     There are no active problems to display for this patient.   History reviewed. No pertinent surgical history.  OB History    No data available       Home Medications    Prior to Admission medications   Medication Sig Start Date End Date Taking? Authorizing Provider  gabapentin (NEURONTIN) 100 MG capsule Take 100 mg by mouth 3 (three) times daily as needed. 05/15/16  Yes Historical Provider, MD  mirtazapine (REMERON) 15 MG tablet Take 7.5 mg by mouth at bedtime. 05/15/16  Yes Historical Provider, MD  traMADol (ULTRAM) 50 MG tablet Take 50 mg by mouth 3 (three) times daily as needed for pain. 05/18/16  Yes Historical Provider, MD  B Complex Vitamins (VITAMIN B COMPLEX) TABS Take 1 tablet by mouth daily.    Historical Provider, MD    Family History No family history on file.  Social History Social History  Substance Use Topics  . Smoking status: Never Smoker  . Smokeless tobacco: Never Used  . Alcohol use No     Allergies   Cymbalta [duloxetine hcl] and Duloxetine   Review of Systems Review of Systems  All other systems reviewed and are negative.    Physical Exam Updated Vital Signs BP 102/60   Pulse 77   Temp 97.9 F (36.6 C) (Oral)   Resp 16   LMP 04/28/2016  (Approximate)   SpO2 100%   Physical Exam  Constitutional: She is oriented to person, place, and time. She appears well-developed and well-nourished. No distress.  HENT:  Head: Normocephalic and atraumatic.  Eyes: EOM are normal.  Neck: Normal range of motion.  Cardiovascular: Normal rate, regular rhythm and normal heart sounds.   Pulmonary/Chest: Effort normal and breath sounds normal.  Abdominal: Soft. She exhibits no distension.  Right-sided abdominal tenderness with tenderness both in the right upper quadrant and the right lower quadrant.  Musculoskeletal: Normal range of motion.  Neurological: She is alert and oriented to person, place, and time.  Skin: Skin is warm and dry.  Psychiatric: She has a normal mood and affect. Judgment normal.  Nursing note and vitals reviewed.    ED Treatments / Results  Labs (all labs ordered are listed, but only abnormal results are displayed) Labs Reviewed  COMPREHENSIVE METABOLIC PANEL - Abnormal; Notable for the following:       Result Value   Sodium 134 (*)    CO2 21 (*)    Glucose, Bld 177 (*)    ALT 12 (*)    All other components within normal limits  CBC - Abnormal; Notable for the following:    WBC 20.3 (*)    All other components within normal limits  URINALYSIS, ROUTINE W REFLEX MICROSCOPIC - Abnormal; Notable for the following:    APPearance HAZY (*)    Glucose, UA 50 (*)    Ketones, ur 80 (*)    Protein, ur 30 (*)    Leukocytes, UA LARGE (*)    Bacteria, UA RARE (*)    Squamous Epithelial / LPF 0-5 (*)    All other components within normal limits  URINE CULTURE  LIPASE, BLOOD  I-STAT BETA HCG BLOOD, ED ([facility], [facility], [facility] ONLY)    EKG  EKG Interpretation None       Radiology US Abdomen Complete  Result Date: 05/19/2016 CLINICAL DATA:  Right upper quadrant pain. EXAM: ABDOMEN ULTRASOUND COMPLETE COMPARISON:  None. FINDINGS: Gallbladder: No gallstones or wall thickening visualized. No sonographic Murphy sign noted by  sonographer. Common bile duct: Diameter: 2 mm Liver: No focal lesion identified. Within normal limits in parenchymal echogenicity. IVC: No abnormality visualized. Pancreas: Visualized portion unremarkable. Spleen: Size and appearance within normal limits. Right Kidney: Length: 10.6 cm. Echogenicity within normal limits. No mass or hydronephrosis visualized. Left Kidney: Length: 10.7 cm. Echogenicity within normal limits. No mass or hydronephrosis visualized. Abdominal aorta: No aneurysm visualized. Other findings: None. IMPRESSION: 1. Normal abdominal sonogram. Electronically Signed   By: Signa Kell M.D.   On: 05/19/2016 11:00   Ct Abdomen Pelvis W Contrast  Result Date: 05/19/2016 CLINICAL DATA:  Right-sided abdominal pain. Elevated white blood cell count. EXAM: CT ABDOMEN AND PELVIS WITH CONTRAST TECHNIQUE: Multidetector CT imaging of the abdomen and pelvis was performed using the standard protocol following bolus administration of intravenous contrast. CONTRAST:  80mL ISOVUE-300 IOPAMIDOL (ISOVUE-300) INJECTION 61% COMPARISON:  None. FINDINGS: Lower chest: No acute abnormality. Hepatobiliary: No focal liver abnormality is seen. No gallstones, gallbladder wall thickening, or biliary dilatation. Pancreas: Unremarkable. No pancreatic ductal dilatation or surrounding inflammatory changes. Spleen: Normal in size without focal abnormality. Adrenals/Urinary Tract: Adrenal glands are unremarkable. Kidneys are normal, without renal calculi, focal lesion, or hydronephrosis. Bladder is unremarkable. Stomach/Bowel: Stomach is within normal limits. The appendix is visualized and appears abnormal measuring 11 mm in thickness and exhibits wall thickening measuring 5 mm in thickness, image 40 of series 203. No evidence of bowel wall thickening, distention, or inflammatory changes. No pathologic dilatation of the colon. Vascular/Lymphatic: Normal appearance of the abdominal aorta. No upper abdominal adenopathy. There is  no pelvic or inguinal adenopathy. Reproductive: Uterus and bilateral adnexa are unremarkable. Other: Small amount of free fluid is identified within the pelvis. No discrete fluid collections identified. Musculoskeletal: No acute or significant osseous findings. IMPRESSION: 1. Findings are worrisome for acute appendicitis. No evidence to suggest perforation or abscess. Electronically Signed   By: Signa Kell M.D.   On: 05/19/2016 12:51    Procedures Procedures (including critical care time)  Medications Ordered in ED Medications  cefTRIAXone (ROCEPHIN) 1 g in dextrose 5 % 50 mL IVPB (1 g Intravenous New Bag/Given 05/19/16 1258)  sodium chloride 0.9 % bolus 1,000 mL (0 mLs Intravenous Stopped 05/19/16 1117)  ondansetron (ZOFRAN) injection 4 mg (4 mg Intravenous Given 05/19/16 0932)  morphine 4 MG/ML injection 6 mg (6 mg Intravenous Given 05/19/16 0943)  iopamidol (ISOVUE-300) 61 % injection (80 mLs  Contrast Given 05/19/16 1219)     Initial Impression / Assessment and Plan / ED Course  I have reviewed the triage vital signs and the nursing notes.  Pertinent labs & imaging results that were available during my care of the patient were reviewed by me and  considered in my medical decision making (see chart for details).     Patient with migratory pain and now focal localized tenderness in the right lower quadrant.  Initially complaining of upper abdominal pain and thus an ultrasound was performed which demonstrated no gallstones.  Patient's pain has persisted and is now located in the right lower quadrant.  CT confirms developing appendicitis.  Plan to consult general surgery for likely appendectomy.  Patient updated.  Nothing by mouth.  Final Clinical Impressions(s) / ED Diagnoses   Final diagnoses:  Acute appendicitis, unspecified acute appendicitis type    New Prescriptions New Prescriptions   No medications on file     Azalia Bilis, MD 05/19/16 1310

## 2016-05-19 NOTE — Anesthesia Procedure Notes (Signed)
Procedure Name: Intubation Date/Time: 05/19/2016 7:14 PM Performed by: Brien MatesMAHONY, Abrey Bradway D Pre-anesthesia Checklist: Patient identified, Emergency Drugs available, Suction available, Patient being monitored and Timeout performed Patient Re-evaluated:Patient Re-evaluated prior to inductionOxygen Delivery Method: Circle system utilized Preoxygenation: Pre-oxygenation with 100% oxygen Intubation Type: IV induction, Rapid sequence and Cricoid Pressure applied Laryngoscope Size: Miller and 2 Grade View: Grade I Tube type: Oral Tube size: 7.0 mm Number of attempts: 1 Airway Equipment and Method: Stylet Placement Confirmation: ETT inserted through vocal cords under direct vision,  positive ETCO2 and breath sounds checked- equal and bilateral Secured at: 21 cm Tube secured with: Tape Dental Injury: Teeth and Oropharynx as per pre-operative assessment

## 2016-05-19 NOTE — ED Notes (Signed)
Patient transported to CT 

## 2016-05-20 LAB — URINE CULTURE: Culture: <10000 — AB

## 2016-05-20 NOTE — Progress Notes (Signed)
1 Day Post-Op  Subjective: Having a lot of pain this am. Hasn't taken anything orally yet  Objective: Vital signs in last 24 hours: Temp:  [98.1 F (36.7 C)-99.5 F (37.5 C)] 99.5 F (37.5 C) (01/20 2119) Pulse Rate:  [71-112] 87 (01/20 2119) Resp:  [16-18] 18 (01/20 2119) BP: (84-122)/(33-79) 116/65 (01/20 2119) SpO2:  [96 %-100 %] 98 % (01/20 2119) Weight:  [58 kg (127 lb 12.8 oz)] 58 kg (127 lb 12.8 oz) (01/20 2119)    Intake/Output from previous day: 01/20 0701 - 01/21 0700 In: 2070 [I.V.:2070] Out: 525 [Urine:500; Blood:25] Intake/Output this shift: No intake/output data recorded.  Resp: clear to auscultation bilaterally Cardio: regular rate and rhythm GI: soft, moderate tenderness. incisions ok  Lab Results:   Recent Labs  05/19/16 0122  WBC 20.3*  HGB 12.9  HCT 38.6  PLT 247   BMET  Recent Labs  05/19/16 0122  NA 134*  K 3.7  CL 102  CO2 21*  GLUCOSE 177*  BUN 10  CREATININE 0.70  CALCIUM 9.3   PT/INR No results for input(s): LABPROT, INR in the last 72 hours. ABG No results for input(s): PHART, HCO3 in the last 72 hours.  Invalid input(s): PCO2, PO2  Studies/Results: US Abdomen Complete  Result Date: 05/19/2016 CLINICAL DATA:  Right upper quadrant pain. EXAM: ABDOMEN ULTRASOUND COMPLETE COMPARISON:  None. FINDINGS: Gallbladder: No gallstones or wall thickening visualized. No sonographic Murphy sign noted by sonographer. Common bile duct: Diameter: 2 mm Liver: No focal lesion identified. Within normal limits in parenchymal echogenicity. IVC: No abnormality visualized. Pancreas: Visualized portion unremarkable. Spleen: Size and appearance within normal limits. Right Kidney: Length: 10.6 cm. Echogenicity within normal limits. No mass or hydronephrosis visualized. Left Kidney: Length: 10.7 cm. Echogenicity within normal limits. No mass or hydronephrosis visualized. Abdominal aorta: No aneurysm visualized. Other findings: None. IMPRESSION: 1. Normal  abdominal sonogram. Electronically Signed   By: Signa Kell M.D.   On: 05/19/2016 11:00   Ct Abdomen Pelvis W Contrast  Result Date: 05/19/2016 CLINICAL DATA:  Right-sided abdominal pain. Elevated white blood cell count. EXAM: CT ABDOMEN AND PELVIS WITH CONTRAST TECHNIQUE: Multidetector CT imaging of the abdomen and pelvis was performed using the standard protocol following bolus administration of intravenous contrast. CONTRAST:  80mL ISOVUE-300 IOPAMIDOL (ISOVUE-300) INJECTION 61% COMPARISON:  None. FINDINGS: Lower chest: No acute abnormality. Hepatobiliary: No focal liver abnormality is seen. No gallstones, gallbladder wall thickening, or biliary dilatation. Pancreas: Unremarkable. No pancreatic ductal dilatation or surrounding inflammatory changes. Spleen: Normal in size without focal abnormality. Adrenals/Urinary Tract: Adrenal glands are unremarkable. Kidneys are normal, without renal calculi, focal lesion, or hydronephrosis. Bladder is unremarkable. Stomach/Bowel: Stomach is within normal limits. The appendix is visualized and appears abnormal measuring 11 mm in thickness and exhibits wall thickening measuring 5 mm in thickness, image 40 of series 203. No evidence of bowel wall thickening, distention, or inflammatory changes. No pathologic dilatation of the colon. Vascular/Lymphatic: Normal appearance of the abdominal aorta. No upper abdominal adenopathy. There is no pelvic or inguinal adenopathy. Reproductive: Uterus and bilateral adnexa are unremarkable. Other: Small amount of free fluid is identified within the pelvis. No discrete fluid collections identified. Musculoskeletal: No acute or significant osseous findings. IMPRESSION: 1. Findings are worrisome for acute appendicitis. No evidence to suggest perforation or abscess. Electronically Signed   By: Signa Kell M.D.   On: 05/19/2016 12:51    Anti-infectives: Anti-infectives    Start     Dose/Rate Route Frequency Ordered Stop  05/19/16  2200  piperacillin-tazobactam (ZOSYN) IVPB 3.375 g  Status:  Discontinued     3.375 g 12.5 mL/hr over 240 Minutes Intravenous Every 8 hours 05/19/16 2142 05/19/16 2145   05/19/16 1800  ceFAZolin (ANCEF) 3 g in dextrose 5 % 50 mL IVPB  Status:  Discontinued     3 g 130 mL/hr over 30 Minutes Intravenous On call to O.R. 05/19/16 1730 05/19/16 2115   05/19/16 1200  cefTRIAXone (ROCEPHIN) 1 g in dextrose 5 % 50 mL IVPB     1 g 100 mL/hr over 30 Minutes Intravenous  Once 05/19/16 1145 05/19/16 1328      Assessment/Plan: s/p Procedure(s): APPENDECTOMY LAPAROSCOPIC (N/A) Advance diet  Continue to work on pain control Ambulate Hopefully will be ready for discharge tomorrow  LOS: 1 day    TOTH III,PAUL S 05/20/2016

## 2016-05-20 NOTE — Anesthesia Postprocedure Evaluation (Addendum)
Anesthesia Post Note  Patient: Candice Cruz  Procedure(s) Performed: Procedure(s) (LRB): APPENDECTOMY LAPAROSCOPIC (N/A)  Patient location during evaluation: PACU Anesthesia Type: General Level of consciousness: awake and alert Pain management: pain level controlled Vital Signs Assessment: post-procedure vital signs reviewed and stable Respiratory status: spontaneous breathing, nonlabored ventilation, respiratory function stable and patient connected to nasal cannula oxygen Cardiovascular status: blood pressure returned to baseline and stable Postop Assessment: no signs of nausea or vomiting Anesthetic complications: no       Last Vitals:  Vitals:   05/19/16 2100 05/19/16 2119  BP:  116/65  Pulse:  87  Resp:  18  Temp: 37 C 37.5 C    Last Pain:  Vitals:   05/20/16 0316  TempSrc:   PainSc: 4                  Shelton SilvasKevin D Melek Pownall

## 2016-05-21 ENCOUNTER — Encounter (HOSPITAL_COMMUNITY): Payer: Self-pay | Admitting: Surgery

## 2016-05-21 MED ORDER — OXYCODONE HCL 5 MG PO TABS: 2.5000 mg | ORAL_TABLET | Freq: Four times a day (QID) | ORAL | 0 refills | Status: DC | PRN

## 2016-05-21 NOTE — Discharge Summary (Signed)
[facility] Discharge Summary   Patient ID: Candice BeetsLizbeth Viglione MRN: [id number] DOB/AGE: 23/04/1995 20 y.o.  Admit date: 05/19/2016 Discharge date: 05/21/2016  Admitting Diagnosis: Acute appendicitis   Discharge Diagnosis Patient Active Problem List   Diagnosis Date Noted  . Acute appendicitis 05/19/2016    Consultants None   Imaging: Ct Abdomen Pelvis W Contrast  Result Date: 05/19/2016 CLINICAL DATA:  Right-sided abdominal pain. Elevated white blood cell count. EXAM: CT ABDOMEN AND PELVIS WITH CONTRAST TECHNIQUE: Multidetector CT imaging of the abdomen and pelvis was performed using the standard protocol following bolus administration of intravenous contrast. CONTRAST:  80mL ISOVUE-300 IOPAMIDOL (ISOVUE-300) INJECTION 61% COMPARISON:  None. FINDINGS: Lower chest: No acute abnormality. Hepatobiliary: No focal liver abnormality is seen. No gallstones, gallbladder wall thickening, or biliary dilatation. Pancreas: Unremarkable. No pancreatic ductal dilatation or surrounding inflammatory changes. Spleen: Normal in size without focal abnormality. Adrenals/Urinary Tract: Adrenal glands are unremarkable. Kidneys are normal, without renal calculi, focal lesion, or hydronephrosis. Bladder is unremarkable. Stomach/Bowel: Stomach is within normal limits. The appendix is visualized and appears abnormal measuring 11 mm in thickness and exhibits wall thickening measuring 5 mm in thickness, image 40 of series 203. No evidence of bowel wall thickening, distention, or inflammatory changes. No pathologic dilatation of the colon. Vascular/Lymphatic: Normal appearance of the abdominal aorta. No upper abdominal adenopathy. There is no pelvic or inguinal adenopathy. Reproductive: Uterus and bilateral adnexa are unremarkable. Other: Small amount of free fluid is identified within the pelvis. No discrete fluid collections identified. Musculoskeletal: No acute or significant osseous findings. IMPRESSION: 1.  Findings are worrisome for acute appendicitis. No evidence to suggest perforation or abscess. Electronically Signed   By: Signa Kellaylor  Stroud M.D.   On: 05/19/2016 12:51    Procedures  Dr. Maisie Fushomas Cornett (05/20/15) - Laparoscopic Appendectomy  Hospital Course:  21 y/o female [facility] 24 hours of right lower quadrant abdominal pain.  CT scan above suggestive of acute appendicitis, leukocytosis of 20.3.  Patient was admitted and underwent procedure listed above.  Tolerated procedure well and was transferred to the floor.  Diet was advanced as tolerated.  On POD#2, the patient was voiding well, tolerating diet, ambulating well, pain well controlled, vital signs stable, incisions c/d/i and felt stable for discharge home.  Patient will follow up in our office in 2 weeks and knows to call with questions or concerns.    Physical Exam: General:  Alert, NAD, pleasant, comfortable Abd:  Soft, ND, appropriately tender, incisions C/D/I  Allergies as of 05/21/2016      Reactions   Cymbalta [duloxetine Hcl]    Duloxetine Other (See Comments)   Panic attacks      Medication List    STOP taking these medications   traMADol 50 MG tablet Commonly known as:  ULTRAM     TAKE these medications   gabapentin 100 MG capsule Commonly known as:  NEURONTIN Take 100 mg by mouth 3 (three) times daily as needed.   mirtazapine 15 MG tablet Commonly known as:  REMERON Take 7.5 mg by mouth at bedtime.   oxyCODONE 5 MG immediate release tablet Commonly known as:  Oxy IR/ROXICODONE Take 0.5-1 tablets (2.5-5 mg total) by mouth every 6 (six) hours as needed for severe pain.   Vitamin B Complex Tabs Take 1 tablet by mouth daily.        Follow-up Information    [facility] [place] [place] 2 week([facility]).   Specialty:  General Surgery Why:  for  post-operative follow up. call to confirm appointment date and time Contact information: [address] [place]  [place] [address] [phone]          Signed: Hosie Spangle, [facility] 05/21/2016, 11:41 AM Pager: [phone] Consults: [phone] Mon-Fri 7:00 am-4:30 pm Sat-Sun 7:00 am-11:30 am

## 2016-05-21 NOTE — Discharge Instructions (Signed)
please arrive at least 30 min before your appointment to complete your check in paperwork.  If you are unable to arrive 30 min prior to your appointment time we may have to cancel or reschedule you. ° °LAPAROSCOPIC SURGERY: POST OP INSTRUCTIONS  °1. DIET: Follow a light bland diet the first 24 hours after arrival home, such as soup, liquids, crackers, etc. Be sure to include lots of fluids daily. Avoid fast food or heavy meals as your are more likely to get nauseated. Eat a low fat the next few days after surgery.  °2. Take your usually prescribed home medications unless otherwise directed. °3. PAIN CONTROL:  °1. Pain is best controlled by a usual combination of three different methods TOGETHER:  °1. Ice/Heat °2. Over the counter pain medication °3. Prescription pain medication °2. Most patients will experience some swelling and bruising around the incisions. Ice packs or heating pads (30-60 minutes up to 6 times a day) will help. Use ice for the first few days to help decrease swelling and bruising, then switch to heat to help relax tight/sore spots and speed recovery. Some people prefer to use ice alone, heat alone, alternating between ice & heat. Experiment to what works for you. Swelling and bruising can take several weeks to resolve.  °3. It is helpful to take an over-the-counter pain medication regularly for the first few weeks. Choose one of the following that works best for you:  °1. Naproxen (Aleve, etc) Two 220mg tabs twice a day °2. Ibuprofen (Advil, etc) Three 200mg tabs four times a day (every meal & bedtime) °3. Acetaminophen (Tylenol, etc) 500-650mg four times a day (every meal & bedtime) °4. A prescription for pain medication (such as oxycodone, hydrocodone, etc) should be given to you upon discharge. Take your pain medication as prescribed.  °1. If you are having problems/concerns with the prescription medicine (does not control pain, nausea, vomiting, rash, itching, etc), please call us [phone] to see if we need to switch you to a different pain medicine that will work better for you and/or control your side effect better. °2. If you need a refill on your pain medication, please contact your pharmacy. They will contact our office to request authorization. Prescriptions will not be filled after 5 pm or on week-ends. °4. Avoid getting constipated. Between the surgery and the pain medications, it is common to experience some constipation. Increasing fluid intake and taking a fiber supplement (such as Metamucil, Citrucel, FiberCon, MiraLax, etc) 1-2 times a day regularly will usually help prevent this problem from occurring. A mild laxative (prune juice, Milk of Magnesia, MiraLax, etc) should be taken according to package directions if there are no bowel movements after 48 hours.  °5. Watch out for diarrhea. If you have many loose bowel movements, simplify your diet to bland foods & liquids for a few days. Stop any stool softeners and decrease your fiber supplement. Switching to mild anti-diarrheal medications (Kayopectate, Pepto Bismol) can help. If this worsens or does not improve, please call us. °6. Wash / shower every day. You may shower over the dressings as they are waterproof. Continue to shower over incision(s) after the dressing is off. °7. Remove your waterproof bandages 5 days after surgery. You may leave the incision open to air. You may replace a dressing/Band-Aid to cover the incision for comfort if you wish.  °8. ACTIVITIES as tolerated:  °1. You may resume regular (light) daily activities beginning the next day--such as daily self-care, walking, climbing stairs--gradually   increasing activities as tolerated. If you can walk 30 minutes without difficulty, it is safe to try more intense activity such as jogging, treadmill, bicycling, low-impact aerobics, swimming, etc. °2. Save the most intensive and strenuous activity for last such as sit-ups, heavy lifting, contact sports, etc Refrain  from any heavy lifting or straining until you are off narcotics for pain control.  °3. DO NOT PUSH THROUGH PAIN. Let pain be your guide: If it hurts to do something, don't do it. Pain is your body warning you to avoid that activity for another week until the pain goes down. °4. You may drive when you are no longer taking prescription pain medication, you can comfortably wear a seatbelt, and you can safely maneuver your car and apply brakes. °5. You may have sexual intercourse when it is comfortable.  °9. FOLLOW UP in our office  °1. Please call [facility] at [phone] to set up an appointment to see your surgeon in the office for a follow-up appointment approximately 2-3 weeks after your surgery. °2. Make sure that you call for this appointment the day you arrive home to insure a convenient appointment time. °     10. IF YOU HAVE DISABILITY OR FAMILY LEAVE FORMS, BRING THEM TO THE               OFFICE FOR PROCESSING.  ° °WHEN TO CALL US [phone]:  °1. Poor pain control °2. Reactions / problems with new medications (rash/itching, nausea, etc)  °3. Fever over 101.5 F (38.5 C) °4. Inability to urinate °5. Nausea and/or vomiting °6. Worsening swelling or bruising °7. Continued bleeding from incision. °8. Increased pain, redness, or drainage from the incision ° °The clinic staff is available to answer your questions during regular business hours (8:30am-5pm). Please don’t hesitate to call and ask to speak to one of our nurses for clinical concerns.  °If you have a medical emergency, go to the nearest emergency room or call 911.  °A surgeon from [facility] is always on call at the hospitals  ° °[facility]  °[address], [address], [place], [place] [address] ?  °MAIN: [phone] ? TOLL FREE: [phone] ?  °FAX [phone]  °[email or web address] ° °

## 2016-05-29 DIAGNOSIS — F4310 Post-traumatic stress disorder, unspecified: Secondary | ICD-10-CM | POA: Diagnosis not present

## 2016-05-29 DIAGNOSIS — F411 Generalized anxiety disorder: Secondary | ICD-10-CM | POA: Diagnosis not present

## 2016-05-29 DIAGNOSIS — G894 Chronic pain syndrome: Secondary | ICD-10-CM | POA: Diagnosis not present

## 2016-05-29 DIAGNOSIS — F332 Major depressive disorder, recurrent severe without psychotic features: Secondary | ICD-10-CM | POA: Diagnosis not present

## 2016-06-26 DIAGNOSIS — F4310 Post-traumatic stress disorder, unspecified: Secondary | ICD-10-CM | POA: Diagnosis not present

## 2016-06-26 DIAGNOSIS — F332 Major depressive disorder, recurrent severe without psychotic features: Secondary | ICD-10-CM | POA: Diagnosis not present

## 2016-06-26 DIAGNOSIS — F411 Generalized anxiety disorder: Secondary | ICD-10-CM | POA: Diagnosis not present

## 2016-06-26 DIAGNOSIS — G894 Chronic pain syndrome: Secondary | ICD-10-CM | POA: Diagnosis not present

## 2016-09-28 NOTE — Addendum Note (Signed)
Addendum  created 09/28/16 0920 by Camilah Spillman D, MD   Sign clinical note    

## 2017-01-30 DIAGNOSIS — E559 Vitamin D deficiency, unspecified: Secondary | ICD-10-CM | POA: Diagnosis not present

## 2017-01-30 DIAGNOSIS — G894 Chronic pain syndrome: Secondary | ICD-10-CM | POA: Diagnosis not present

## 2017-01-30 DIAGNOSIS — Z23 Encounter for immunization: Secondary | ICD-10-CM | POA: Diagnosis not present

## 2017-01-30 DIAGNOSIS — J309 Allergic rhinitis, unspecified: Secondary | ICD-10-CM | POA: Diagnosis not present

## 2017-01-30 DIAGNOSIS — F411 Generalized anxiety disorder: Secondary | ICD-10-CM | POA: Diagnosis not present

## 2017-01-30 DIAGNOSIS — F331 Major depressive disorder, recurrent, moderate: Secondary | ICD-10-CM | POA: Diagnosis not present

## 2017-08-10 IMAGING — CT CT ABD-PELV W/ CM
2 of 4 series · 12 of 46 positions shown, 14 images · IV contrast (Iodine)
Comparison: None.

CLINICAL DATA: Right-sided abdominal pain. Elevated white blood
cell count.

EXAM:
CT ABDOMEN AND PELVIS WITH CONTRAST
TECHNIQUE: Multidetector CT imaging of the abdomen and pelvis was performed
using the standard protocol following bolus administration of
intravenous contrast.
CONTRAST:  80mL 4ZOXJK-PMM IOPAMIDOL (4ZOXJK-PMM) INJECTION 61%

[Series 201: routine, idose (2) · axial · 0.65mm/px · z∈[+114,+494]mm · 9 of 92 slices shown, 11 images]
[im 8/92  soft-tissue]
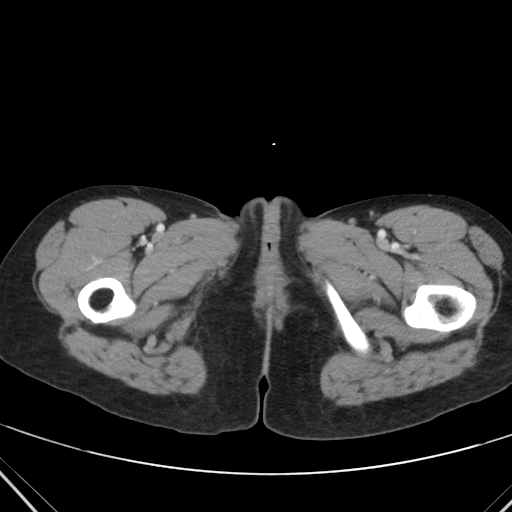
[im 8/92  bone]
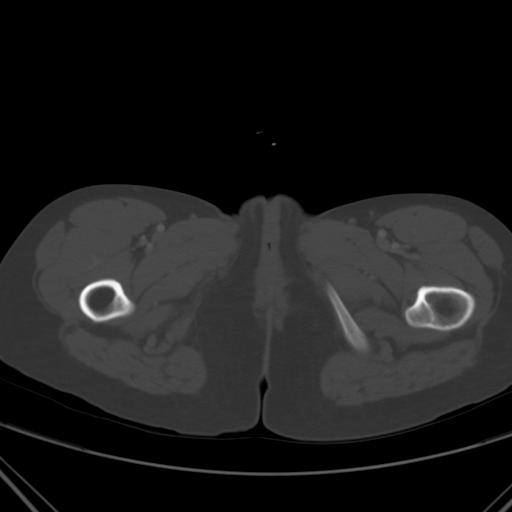
[im 16/92  soft-tissue]
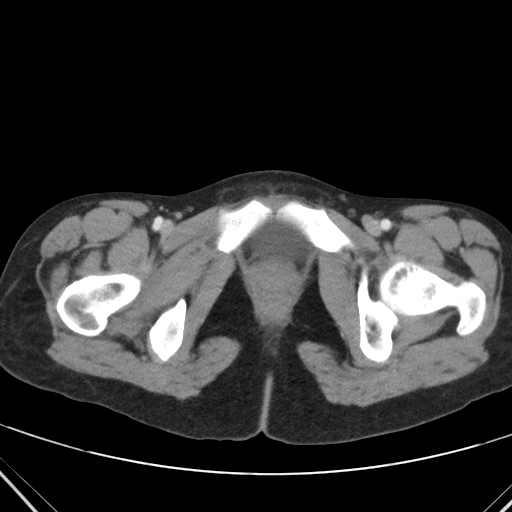
[im 28/92  soft-tissue]
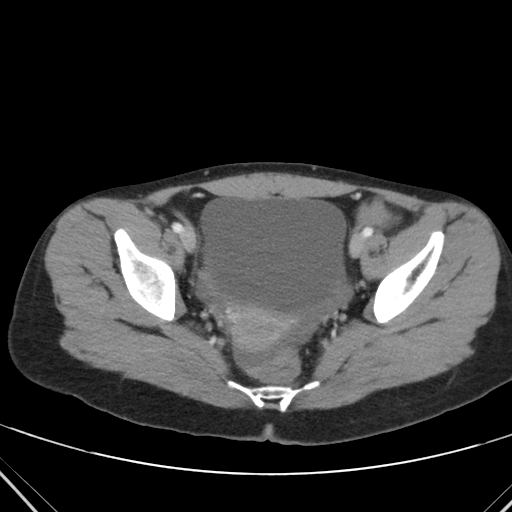
[im 36/92  soft-tissue]
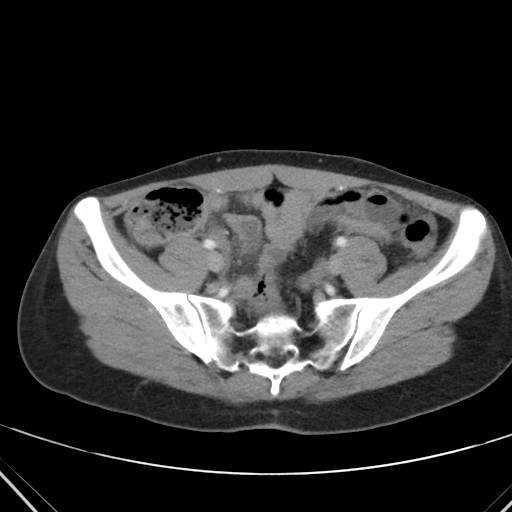
[im 48/92  soft-tissue]
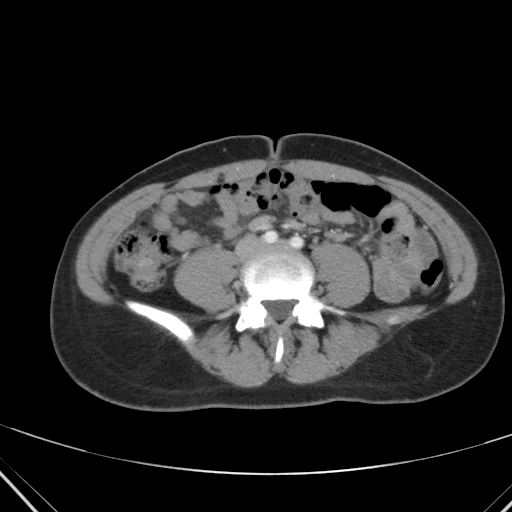
[im 56/92  soft-tissue]
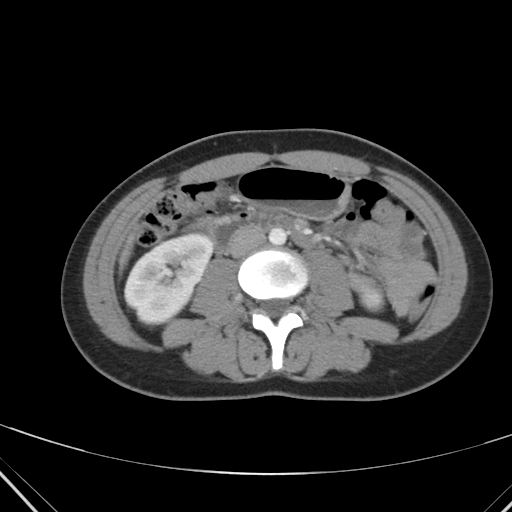
[im 64/92  soft-tissue]
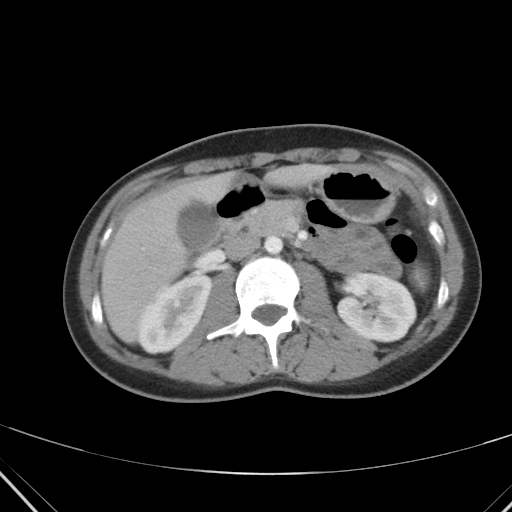
[im 76/92  soft-tissue]
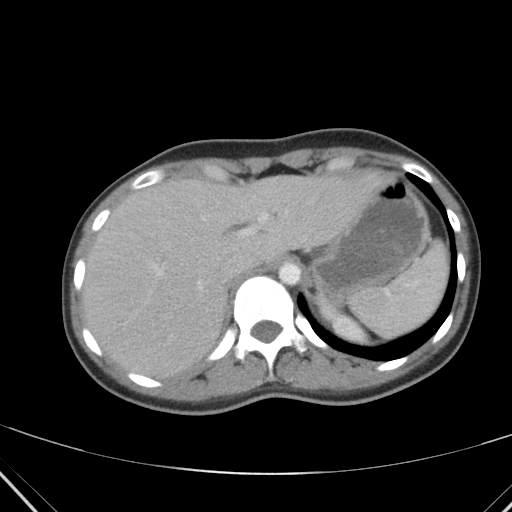
[im 84/92  soft-tissue]
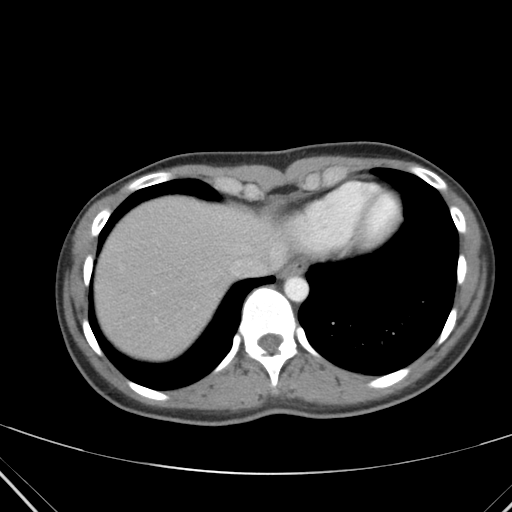
[im 84/92  bone]
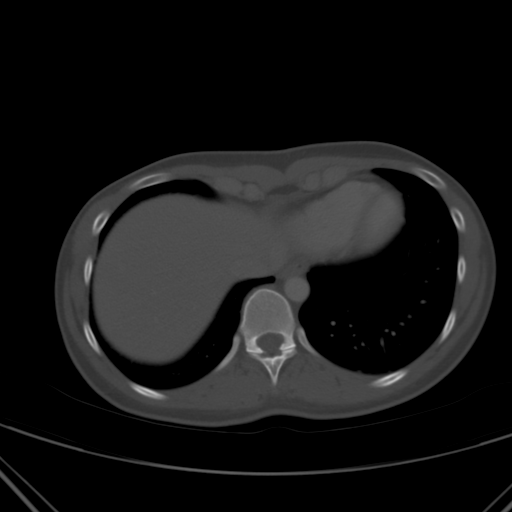

[Series 203: coronals, idose (2) · coronal · 0.45mm/px · 3 of 109 slices shown]
[im 37/109  soft-tissue]
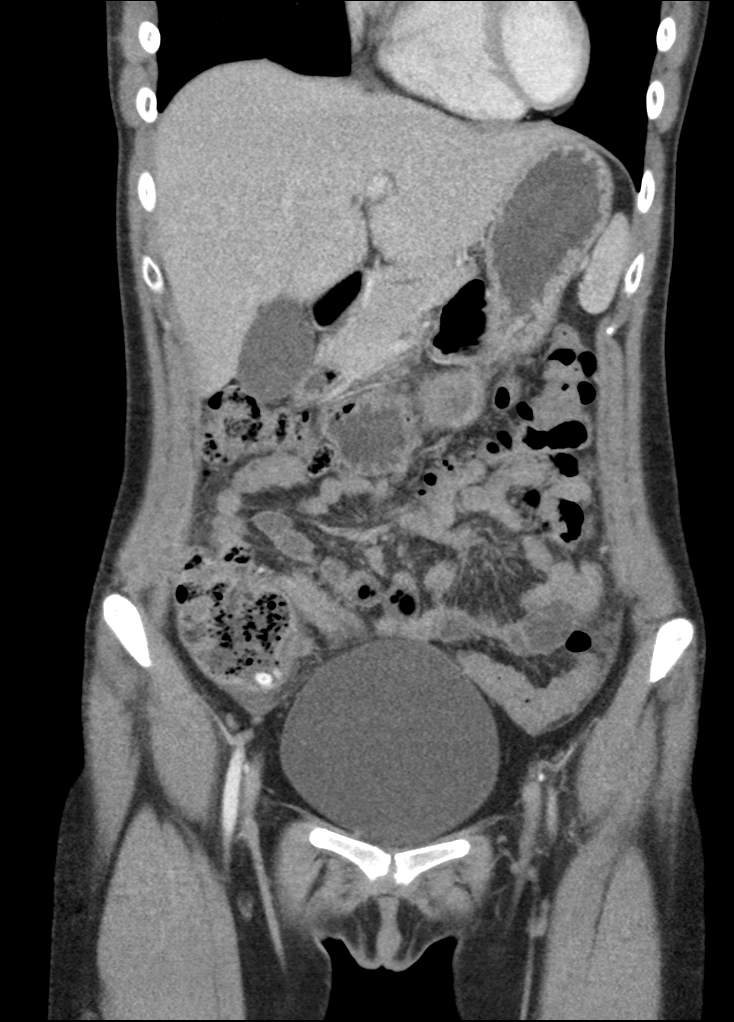
[im 49/109  soft-tissue]
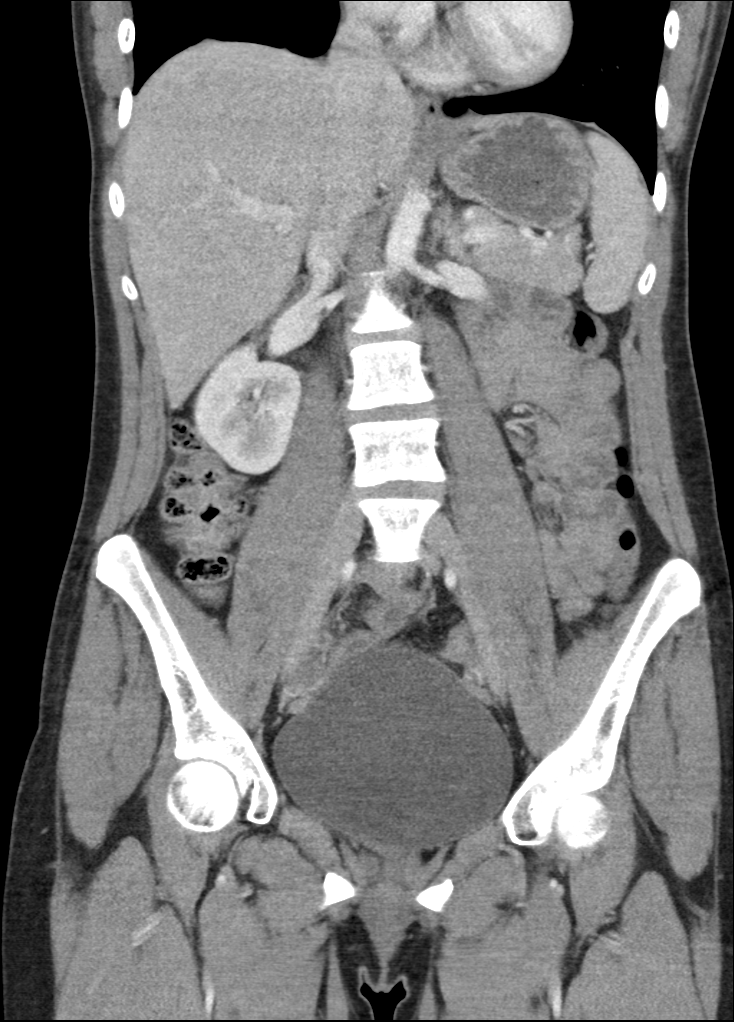
[im 61/109  soft-tissue]
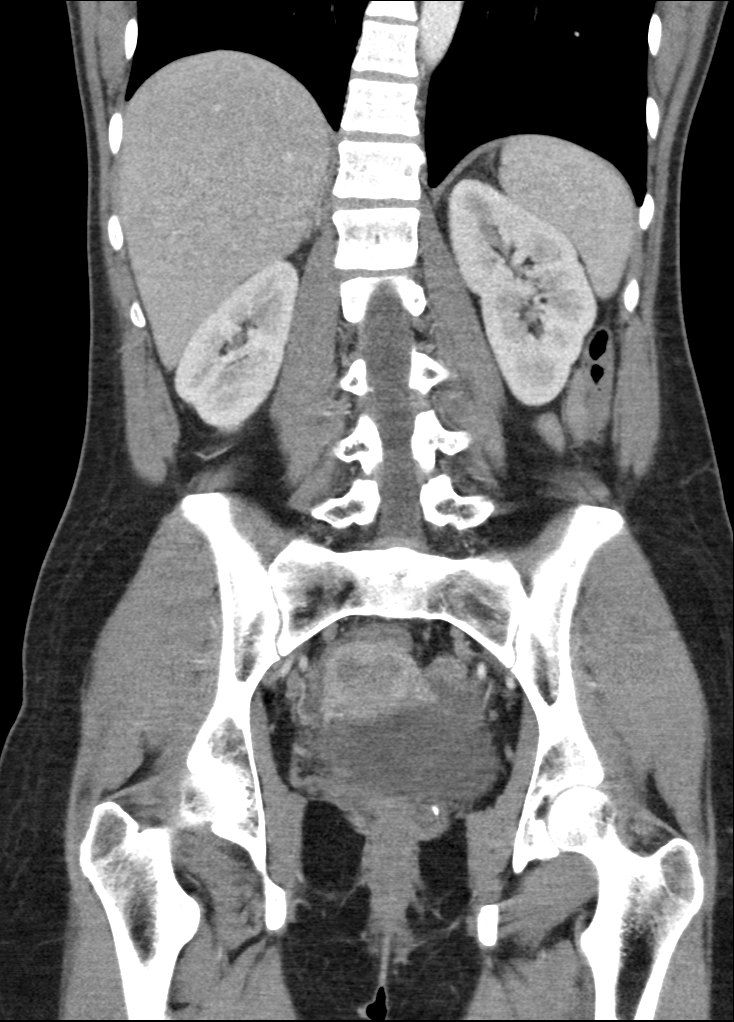

[12 of 46 positions shown; findings below may reference images not displayed]

FINDINGS: Lower chest: No acute abnormality.

Hepatobiliary: No focal liver abnormality is seen. No gallstones,
gallbladder wall thickening, or biliary dilatation.

Pancreas: Unremarkable. No pancreatic ductal dilatation or
surrounding inflammatory changes.

Spleen: Normal in size without focal abnormality.

Adrenals/Urinary Tract: Adrenal glands are unremarkable. Kidneys are
normal, without renal calculi, focal lesion, or hydronephrosis.
Bladder is unremarkable.

Stomach/Bowel: Stomach is within normal limits. The appendix is
visualized and appears abnormal measuring 11 mm in thickness and
exhibits wall thickening measuring 5 mm in thickness, image 40 of
series 203. No evidence of bowel wall thickening, distention, or
inflammatory changes. No pathologic dilatation of the colon.

Vascular/Lymphatic: Normal appearance of the abdominal aorta. No
upper abdominal adenopathy. There is no pelvic or inguinal
adenopathy.

Reproductive: Uterus and bilateral adnexa are unremarkable.

Other: Small amount of free fluid is identified within the pelvis.
No discrete fluid collections identified.

Musculoskeletal: No acute or significant osseous findings.
IMPRESSION: 1. Findings are worrisome for acute appendicitis. No evidence to
suggest perforation or abscess.

## 2017-08-29 DIAGNOSIS — R102 Pelvic and perineal pain: Secondary | ICD-10-CM | POA: Diagnosis not present

## 2017-09-24 DIAGNOSIS — R202 Paresthesia of skin: Secondary | ICD-10-CM | POA: Diagnosis not present

## 2017-09-24 DIAGNOSIS — R110 Nausea: Secondary | ICD-10-CM | POA: Diagnosis not present

## 2017-09-24 DIAGNOSIS — R8271 Bacteriuria: Secondary | ICD-10-CM | POA: Diagnosis not present

## 2017-11-21 DIAGNOSIS — Z0000 Encounter for general adult medical examination without abnormal findings: Secondary | ICD-10-CM | POA: Diagnosis not present

## 2017-11-21 DIAGNOSIS — E559 Vitamin D deficiency, unspecified: Secondary | ICD-10-CM | POA: Diagnosis not present

## 2017-11-21 DIAGNOSIS — Z131 Encounter for screening for diabetes mellitus: Secondary | ICD-10-CM | POA: Diagnosis not present

## 2018-04-15 DIAGNOSIS — Z23 Encounter for immunization: Secondary | ICD-10-CM | POA: Diagnosis not present

## 2018-06-22 DIAGNOSIS — J029 Acute pharyngitis, unspecified: Secondary | ICD-10-CM | POA: Diagnosis not present

## 2018-06-22 DIAGNOSIS — R509 Fever, unspecified: Secondary | ICD-10-CM | POA: Diagnosis not present

## 2018-06-22 DIAGNOSIS — R6889 Other general symptoms and signs: Secondary | ICD-10-CM | POA: Diagnosis not present

## 2018-07-31 DIAGNOSIS — Z111 Encounter for screening for respiratory tuberculosis: Secondary | ICD-10-CM | POA: Diagnosis not present

## 2018-11-26 DIAGNOSIS — Z0000 Encounter for general adult medical examination without abnormal findings: Secondary | ICD-10-CM | POA: Diagnosis not present

## 2018-11-26 DIAGNOSIS — E559 Vitamin D deficiency, unspecified: Secondary | ICD-10-CM | POA: Diagnosis not present

## 2018-11-26 DIAGNOSIS — Z13220 Encounter for screening for lipoid disorders: Secondary | ICD-10-CM | POA: Diagnosis not present

## 2019-01-16 DIAGNOSIS — G4700 Insomnia, unspecified: Secondary | ICD-10-CM | POA: Diagnosis not present

## 2019-01-16 DIAGNOSIS — M549 Dorsalgia, unspecified: Secondary | ICD-10-CM | POA: Diagnosis not present

## 2021-04-30 DIAGNOSIS — R112 Nausea with vomiting, unspecified: Secondary | ICD-10-CM

## 2021-04-30 HISTORY — DX: Nausea with vomiting, unspecified: R11.2

## 2021-05-08 ENCOUNTER — Emergency Department (HOSPITAL_BASED_OUTPATIENT_CLINIC_OR_DEPARTMENT_OTHER): Payer: Commercial Managed Care - HMO

## 2021-05-08 ENCOUNTER — Emergency Department (HOSPITAL_COMMUNITY): Payer: Commercial Managed Care - HMO

## 2021-05-08 ENCOUNTER — Encounter (HOSPITAL_COMMUNITY): Payer: Self-pay | Admitting: Emergency Medicine

## 2021-05-08 ENCOUNTER — Ambulatory Visit (HOSPITAL_COMMUNITY)
Admission: EM | Admit: 2021-05-08 | Discharge: 2021-05-10 | Disposition: A | Discharge status: Home / Self Care | Payer: Commercial Managed Care - HMO | Attending: Surgery | Admitting: Surgery

## 2021-05-08 DIAGNOSIS — I82409 Acute embolism and thrombosis of unspecified deep veins of unspecified lower extremity: Secondary | ICD-10-CM

## 2021-05-08 DIAGNOSIS — I82621 Acute embolism and thrombosis of deep veins of right upper extremity: Secondary | ICD-10-CM | POA: Diagnosis not present

## 2021-05-08 DIAGNOSIS — M7989 Other specified soft tissue disorders: Secondary | ICD-10-CM

## 2021-05-08 DIAGNOSIS — Z20822 Contact with and (suspected) exposure to covid-19: Secondary | ICD-10-CM | POA: Insufficient documentation

## 2021-05-08 DIAGNOSIS — I8290 Acute embolism and thrombosis of unspecified vein: Secondary | ICD-10-CM

## 2021-05-08 HISTORY — DX: Acute embolism and thrombosis of unspecified deep veins of unspecified lower extremity: I82.409

## 2021-05-08 LAB — CBC WITH DIFFERENTIAL/PLATELET
Abs Immature Granulocytes: 0.04 10*3/uL (ref 0.00–0.07)
Basophils Absolute: 0 10*3/uL (ref 0.0–0.1)
Basophils Relative: 0 %
Eosinophils Absolute: 0 10*3/uL (ref 0.0–0.5)
Eosinophils Relative: 0 %
HCT: 41.7 % (ref 36.0–46.0)
Hemoglobin: 14.1 g/dL (ref 12.0–15.0)
Immature Granulocytes: 1 %
Lymphocytes Relative: 16 %
Lymphs Abs: 1.4 10*3/uL (ref 0.7–4.0)
MCH: 30.2 pg (ref 26.0–34.0)
MCHC: 33.8 g/dL (ref 30.0–36.0)
MCV: 89.3 fL (ref 80.0–100.0)
Monocytes Absolute: 0.5 10*3/uL (ref 0.1–1.0)
Monocytes Relative: 6 %
Neutro Abs: 6.9 10*3/uL (ref 1.7–7.7)
Neutrophils Relative %: 77 %
Platelets: 214 10*3/uL (ref 150–400)
RBC: 4.67 MIL/uL (ref 3.87–5.11)
RDW: 11.6 % (ref 11.5–15.5)
WBC: 8.9 10*3/uL (ref 4.0–10.5)
nRBC: 0 % (ref 0.0–0.2)

## 2021-05-08 LAB — COMPREHENSIVE METABOLIC PANEL
ALT: 15 U/L (ref 0–44)
AST: 23 U/L (ref 15–41)
Albumin: 4.5 g/dL (ref 3.5–5.0)
Alkaline Phosphatase: 55 U/L (ref 38–126)
Anion gap: 10 (ref 5–15)
BUN: 9 mg/dL (ref 6–20)
CO2: 18 mmol/L — ABNORMAL LOW (ref 22–32)
Calcium: 8.6 mg/dL — ABNORMAL LOW (ref 8.9–10.3)
Chloride: 107 mmol/L (ref 98–111)
Creatinine, Ser: 0.72 mg/dL (ref 0.44–1.00)
GFR, Estimated: >60 mL/min (ref >60)
Glucose, Bld: 96 mg/dL (ref 70–99)
Potassium: 4.1 mmol/L (ref 3.5–5.1)
Sodium: 135 mmol/L (ref 135–145)
Total Bilirubin: 1.8 mg/dL — ABNORMAL HIGH (ref 0.3–1.2)
Total Protein: 7.2 g/dL (ref 6.5–8.1)

## 2021-05-08 LAB — I-STAT BETA HCG BLOOD, ED (MC, WL, AP ONLY): I-stat hCG, quantitative: <5 m[IU]/mL (ref <5)

## 2021-05-08 MED ORDER — IOHEXOL 350 MG/ML SOLN
80.0000 mL | Freq: Once | INTRAVENOUS | Status: AC | PRN
  Administered 2021-05-08: 80 mL via INTRAVENOUS

## 2021-05-08 NOTE — ED Triage Notes (Signed)
Patient sent to [facility] by PCP for evaluation of right swollen, numb, and discolored forearm. Patient states these symptoms were noticed when she woke this morning but have slowly improved over the course of the day. Patient alert, oriented, ambulatory, and in no apparent distress at this time.

## 2021-05-08 NOTE — Progress Notes (Signed)
VASCULAR LAB    Right upper extremity venous duplex has been performed.  See CV proc for preliminary results.  Gave verbal results to Parke Poisson, PA-C  Sharman Garrott, RVT 05/08/2021, 3:50 PM

## 2021-05-08 NOTE — ED Provider Triage Note (Signed)
Emergency Medicine Provider Triage Evaluation Note  Candice Cruz , a 26 y.o. female  was evaluated in triage.  Pt complains of swelling to right upper extremity.  Patient reports that she woke this morning and noticed swelling to right upper extremity.  Patient also reports that she had decreased sensation to right forearm.  Patient denies any traumatic injuries, history of DVT/PE, history of malignancy, hormone therapy, IV drug use.  Patient went to urgent care earlier this morning was sent to ED for further evaluation.  Patient is left-hand dominant.  Review of Systems  Positive: Decreased sensation to right upper extremity, swelling to right upper extremity, color change Negative: Fever, chills, chest pain, shortness of breath  Physical Exam  BP 121/86 (BP Location: Right Arm)    Pulse 87    Temp 99.5 F (37.5 C) (Oral)    Resp 15    SpO2 100%  Gen:   Awake, no distress   Resp:  Normal effort  MSK:   Moves extremities without difficulty, full range of motion to all digits of right hand, right wrist, right elbow, and right shoulder.  Patient has swelling throughout right upper extremity.  +1 right radial pulse.  +2 left radial pulse. Other:   Medical Decision Making  Medically screening exam initiated at 1:45 PM.  Appropriate orders placed.  Candice Cruz was informed that the remainder of the evaluation will be completed by another provider, this initial triage assessment does not replace that evaluation, and the importance of remaining in the ED until their evaluation is complete.     Candice Cruz, [place] 05/08/21 1356

## 2021-05-09 ENCOUNTER — Emergency Department (HOSPITAL_COMMUNITY): Payer: Commercial Managed Care - HMO

## 2021-05-09 ENCOUNTER — Other Ambulatory Visit: Payer: Self-pay

## 2021-05-09 ENCOUNTER — Encounter (HOSPITAL_COMMUNITY): Payer: Self-pay | Admitting: Emergency Medicine

## 2021-05-09 ENCOUNTER — Ambulatory Visit (HOSPITAL_COMMUNITY)
Admission: EM | Disposition: A | Discharge status: Home / Self Care | Payer: Self-pay | Source: Home / Self Care | Attending: Emergency Medicine

## 2021-05-09 DIAGNOSIS — I82409 Acute embolism and thrombosis of unspecified deep veins of unspecified lower extremity: Secondary | ICD-10-CM | POA: Diagnosis present

## 2021-05-09 HISTORY — PX: PERIPHERAL VASCULAR BALLOON ANGIOPLASTY: CATH118281

## 2021-05-09 HISTORY — PX: UPPER EXTREMITY VENOGRAPHY: CATH118272

## 2021-05-09 HISTORY — PX: INTRAVASCULAR ULTRASOUND/IVUS: CATH118244

## 2021-05-09 HISTORY — PX: PERIPHERAL VASCULAR THROMBECTOMY: CATH118306

## 2021-05-09 LAB — POCT ACTIVATED CLOTTING TIME: Activated Clotting Time: 167 seconds

## 2021-05-09 LAB — RESP PANEL BY RT-PCR (FLU A&B, COVID) ARPGX2
Influenza A by PCR: NEGATIVE
Influenza B by PCR: NEGATIVE
SARS Coronavirus 2 by RT PCR: NEGATIVE

## 2021-05-09 SURGERY — UPPER EXTREMITY VENOGRAPHY
Anesthesia: LOCAL | Laterality: Right

## 2021-05-09 MED ORDER — SODIUM CHLORIDE 0.9 % IV SOLN: 250.0000 mL | INTRAVENOUS | Status: DC | PRN

## 2021-05-09 MED ORDER — LIDOCAINE HCL (PF) 1 % IJ SOLN
INTRAMUSCULAR | Status: DC | PRN
  Administered 2021-05-09: 10 mL

## 2021-05-09 MED ORDER — MORPHINE SULFATE (PF) 2 MG/ML IV SOLN: 2.0000 mg | INTRAVENOUS | Status: DC | PRN

## 2021-05-09 MED ORDER — HYDRALAZINE HCL 20 MG/ML IJ SOLN: 5.0000 mg | INTRAMUSCULAR | Status: DC | PRN

## 2021-05-09 MED ORDER — LABETALOL HCL 5 MG/ML IV SOLN: 10.0000 mg | INTRAVENOUS | Status: DC | PRN

## 2021-05-09 MED ORDER — RIVAROXABAN 15 MG PO TABS
15.0000 mg | ORAL_TABLET | Freq: Two times a day (BID) | ORAL | Status: DC
  Administered 2021-05-09 – 2021-05-10 (×2): 15 mg via ORAL
  Filled 2021-05-09 (×2): qty 1

## 2021-05-09 MED ORDER — HEPARIN SODIUM (PORCINE) 1000 UNIT/ML IJ SOLN
INTRAMUSCULAR | Status: DC | PRN
  Administered 2021-05-09: 4000 [IU] via INTRAVENOUS

## 2021-05-09 MED ORDER — ACETAMINOPHEN 325 MG PO TABS
650.0000 mg | ORAL_TABLET | ORAL | Status: DC | PRN
  Filled 2021-05-09: qty 2

## 2021-05-09 MED ORDER — HEPARIN BOLUS VIA INFUSION
4000.0000 [IU] | Freq: Once | INTRAVENOUS | Status: AC
  Administered 2021-05-09: 4000 [IU] via INTRAVENOUS
  Filled 2021-05-09: qty 4000

## 2021-05-09 MED ORDER — HEPARIN (PORCINE) IN NACL 1000-0.9 UT/500ML-% IV SOLN
INTRAVENOUS | Status: AC
  Filled 2021-05-09: qty 1000

## 2021-05-09 MED ORDER — SODIUM CHLORIDE 0.9 % IV SOLN: INTRAVENOUS | Status: DC

## 2021-05-09 MED ORDER — HEPARIN SODIUM (PORCINE) 1000 UNIT/ML IJ SOLN
INTRAMUSCULAR | Status: AC
  Filled 2021-05-09: qty 10

## 2021-05-09 MED ORDER — MIDAZOLAM HCL 2 MG/2ML IJ SOLN
INTRAMUSCULAR | Status: DC | PRN
  Administered 2021-05-09 (×2): 1 mg via INTRAVENOUS

## 2021-05-09 MED ORDER — FENTANYL CITRATE (PF) 100 MCG/2ML IJ SOLN
INTRAMUSCULAR | Status: AC
  Filled 2021-05-09: qty 2

## 2021-05-09 MED ORDER — FENTANYL CITRATE (PF) 100 MCG/2ML IJ SOLN
INTRAMUSCULAR | Status: DC | PRN
  Administered 2021-05-09: 25 ug via INTRAVENOUS
  Administered 2021-05-09: 50 ug via INTRAVENOUS

## 2021-05-09 MED ORDER — HEPARIN (PORCINE) 25000 UT/250ML-% IV SOLN
900.0000 [IU]/h | INTRAVENOUS | Status: DC
  Administered 2021-05-09: 900 [IU]/h via INTRAVENOUS
  Filled 2021-05-09: qty 250

## 2021-05-09 MED ORDER — LIDOCAINE HCL (PF) 1 % IJ SOLN
INTRAMUSCULAR | Status: AC
  Filled 2021-05-09: qty 30

## 2021-05-09 MED ORDER — SODIUM CHLORIDE 0.9% FLUSH: 3.0000 mL | Freq: Two times a day (BID) | INTRAVENOUS | Status: DC

## 2021-05-09 MED ORDER — RIVAROXABAN 20 MG PO TABS: 20.0000 mg | ORAL_TABLET | Freq: Every day | ORAL | Status: DC

## 2021-05-09 MED ORDER — SODIUM CHLORIDE 0.9% FLUSH: 3.0000 mL | INTRAVENOUS | Status: DC | PRN

## 2021-05-09 MED ORDER — HEPARIN (PORCINE) IN NACL 1000-0.9 UT/500ML-% IV SOLN
INTRAVENOUS | Status: DC | PRN
  Administered 2021-05-09 (×2): 500 mL

## 2021-05-09 MED ORDER — ONDANSETRON HCL 4 MG/2ML IJ SOLN
4.0000 mg | Freq: Four times a day (QID) | INTRAMUSCULAR | Status: DC | PRN
  Administered 2021-05-09: 4 mg via INTRAVENOUS
  Filled 2021-05-09: qty 2

## 2021-05-09 MED ORDER — IOHEXOL 350 MG/ML SOLN
47.0000 mL | Freq: Once | INTRAVENOUS | Status: AC | PRN
Start: 2021-05-09 — End: 2021-05-09
  Administered 2021-05-09: 47 mL via INTRAVENOUS

## 2021-05-09 MED ORDER — MIDAZOLAM HCL 5 MG/5ML IJ SOLN
INTRAMUSCULAR | Status: AC
  Filled 2021-05-09: qty 5

## 2021-05-09 MED ORDER — SODIUM CHLORIDE 0.9 % WEIGHT BASED INFUSION: 1.0000 mL/kg/h | INTRAVENOUS | Status: AC

## 2021-05-09 MED ORDER — OXYCODONE HCL 5 MG PO TABS
5.0000 mg | ORAL_TABLET | ORAL | Status: DC | PRN
  Administered 2021-05-09: 10 mg via ORAL
  Filled 2021-05-09: qty 2

## 2021-05-09 MED ORDER — IODIXANOL 320 MG/ML IV SOLN
INTRAVENOUS | Status: DC | PRN
  Administered 2021-05-09: 30 mL via INTRAVENOUS

## 2021-05-09 SURGICAL SUPPLY — 19 items
BALLN MUSTANG 10X60X75 (BALLOONS) ×3
BALLN MUSTANG 8X80X75 (BALLOONS) ×3
BALLOON MUSTANG 10X60X75 (BALLOONS) IMPLANT
BALLOON MUSTANG 8X80X75 (BALLOONS) IMPLANT
CATH ANGIO 5F BER2 65CM (CATHETERS) ×1 IMPLANT
CATH THROMB INTHRILL 4-10 65 (CATHETERS) ×1 IMPLANT
CATH VISIONS PV .035 IVUS (CATHETERS) ×1 IMPLANT
GLIDEWIRE ADV .035X260CM (WIRE) ×1 IMPLANT
KIT ENCORE 26 ADVANTAGE (KITS) ×1 IMPLANT
KIT MICROPUNCTURE NIT STIFF (SHEATH) ×1 IMPLANT
KIT PV (KITS) ×3 IMPLANT
SHEATH INTHRILL 8FR 6 (SHEATH) ×1 IMPLANT
SHEATH PINNACLE 5F 10CM (SHEATH) ×1 IMPLANT
SHEATH PINNACLE 8F 10CM (SHEATH) ×1 IMPLANT
SHEATH PROBE COVER 6X72 (BAG) ×2 IMPLANT
SYR MEDRAD MARK 7 150ML (SYRINGE) ×3 IMPLANT
TRANSDUCER W/STOPCOCK (MISCELLANEOUS) ×3 IMPLANT
TRAY PV CATH (CUSTOM PROCEDURE TRAY) ×3 IMPLANT
WIRE BENTSON .035X145CM (WIRE) ×2 IMPLANT

## 2021-05-09 NOTE — ED Provider Notes (Signed)
Patient signed out to me by previous provider. Please refer to their note for full HPI.  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  She is currently pending vascular evaluation for possible intervention. Physical Exam  BP 118/67    Pulse 89    Temp 98.1 F (36.7 C)    Resp 15    Ht 5' 5.5" (1.664 m)    Wt 50.2 kg    LMP 04/15/2021 (Exact Date)    SpO2 100%    BMI 18.12 kg/m   Physical Exam Vitals and nursing note reviewed.  Constitutional:      Appearance: Normal appearance.  HENT:     Head: Normocephalic.     Mouth/Throat:     Mouth: Mucous membranes are moist.  Pulmonary:     Effort: Pulmonary effort is normal. No respiratory distress.  Skin:    General: Skin is warm.  Neurological:     Mental Status: She is alert and oriented to person, place, and time. Mental status is at baseline.  Psychiatric:        Mood and Affect: Mood normal.    Procedures  Procedures  ED Course / MDM    Medical Decision Making  Vascular has evaluated the patient and plan to admit for procedure and further evaluation today.  Patients evaluation and results requires admission for further treatment and care.  Patient agrees with admission plan, offers no new complaints and is stable/unchanged at time of admit.       Lorelle Gibbs, DO 05/09/21 1025

## 2021-05-09 NOTE — ED Notes (Signed)
Increase acuity 2. Charge RN informed pt with faint right sided pulse. Imaging shows +DVT

## 2021-05-09 NOTE — Progress Notes (Signed)
Claimed that she just threw up small amount of undigested food and claimed that she  feels much better after .  Ice chips given tolerated well. Continue to monitor.

## 2021-05-09 NOTE — Progress Notes (Signed)
Candice Cruz for heparin ->Xarelto Indication:  DVT  Allergies  Allergen Reactions   Amitriptyline     Other reaction(s): dizziness   Cymbalta [Duloxetine Hcl]    Duloxetine Other (See Comments)    Panic attacks    Patient Measurements: Height: 5' 5.5" (166.4 cm) Weight: 50.2 kg (110 lb 9.6 oz) IBW/kg (Calculated) : 58.15  Vital Signs: Temp: 98.2 F (36.8 C) (01/10 1208) Temp Source: Oral (01/10 1208) BP: 114/66 (01/10 1501) Pulse Rate: 0 (01/10 1526)  Labs: Recent Labs    05/08/21 1353  HGB 14.1  HCT 41.7  PLT 214  CREATININE 0.72     Estimated Creatinine Clearance: 83.7 mL/min (by C-G formula based on SCr of 0.72 mg/dL).   Assessment: 26yo female sent to ED by outpt clinic for concern for RUE, [place] confirms thrombus. Pt started on heparin. S/p thrombectomy 1/10 in cath lab. Heparin was continued throughout the procedure and is currently running at 500 units/hr. Bolus of 4000 units also given during procedure.  Pharmacy consulted for Xarelto start for acute DVT.  Goal of Therapy:  Heparin level 0.3-0.7 units/ml Monitor platelets by anticoagulation protocol: Yes   Plan:  Stop heparin when first dose of Xarelto given Xarelto 15mg  po BID x 21 days  On 1/31, change to Xarelto 20mg  daily with evening meal Will f/u for s/s bleeding  Sherlon Handing, PharmD, BCPS Please see amion for complete clinical pharmacist phone list 05/09/2021,3:38 PM

## 2021-05-09 NOTE — Progress Notes (Signed)
Had a brief feeling of nauseous ness after eating. Attributed this from oxy given early.  Requested for ice pack and electric fan-given. Claimed to feel much better few min. Continue to monitor.

## 2021-05-09 NOTE — H&P (Signed)
Hospital Consult    Reason for Consult:  RUE dvt Referring Physician:  Dr. Wilkie Aye MRN #:  [id number]  History of Present Illness: This is a 26 y.o. female without significant previous vascular history.  She denies any history of DVT.  She has a now 2-day history of right upper extremity swelling.  She is never had this before.  She is never noticed any prominent veins on her right upper arm or chest.  She does have some discoloration of her fingers.  She also has some supraclavicular tenderness but otherwise has been in her usual state of health.  She has a planned trip to [place] this coming Saturday for 2 weeks.  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  She previously was a Equities trader.  She is left-hand-dominant.  Past Medical History:  Diagnosis Date   Anxiety    Depression     Past Surgical History:  Procedure Laterality Date   LAPAROSCOPIC APPENDECTOMY N/A 05/19/2016   Procedure: APPENDECTOMY LAPAROSCOPIC;  Surgeon: Harriette Bouillon, MD;  Location: [facility] OR;  Service: General;  Laterality: N/A;    Allergies  Allergen Reactions   Amitriptyline     Other reaction(s): dizziness   Cymbalta [Duloxetine Hcl]    Duloxetine Other (See Comments)    Panic attacks    Prior to Admission medications   Medication Sig Start Date End Date Taking? Authorizing Provider  Fexofenadine HCl (ALLEGRA PO) Take 1 tablet by mouth daily as needed (allergies).   Yes [provider]  gabapentin (NEURONTIN) 400 MG capsule Take 400 mg by mouth daily as needed (pain). 03/28/21  Yes [provider]  GARLIC PO Take 1 capsule by mouth daily.   Yes [provider]  Multiple Vitamins-Minerals (ONE-A-DAY WOMENS) tablet Take 1 tablet by mouth daily.   Yes [provider]  oxyCODONE (OXY IR/ROXICODONE) 5 MG immediate release tablet Take 0.5-1 tablets (2.5-5 mg total) by mouth every 6 (six) hours as needed for severe pain. Patient not taking:  Reported on 05/09/2021 05/21/16   Adam Phenix, PA-C    Social History   Socioeconomic History   Marital status: Single    Spouse name: Not on file   Number of children: Not on file   Years of education: Not on file   Highest education level: Not on file  Occupational History   Not on file  Tobacco Use   Smoking status: Never   Smokeless tobacco: Never  Substance and Sexual Activity   Alcohol use: No   Drug use: No   Sexual activity: Not on file  Other Topics Concern   Not on file  Social History Narrative   Not on file   Social Determinants of Health   Financial Resource Strain: Not on file  Food Insecurity: Not on file  Transportation Needs: Not on file  Physical Activity: Not on file  Stress: Not on file  Social Connections: Not on file  Intimate Partner Violence: Not on file     No family history on file.  Review of Systems  Constitutional: Negative.   HENT: Negative.    Eyes: Negative.   Respiratory: Negative.    Cardiovascular: Negative.   Gastrointestinal: Negative.   Musculoskeletal:        Right upper extremity swelling  Skin: Negative.   Neurological: Negative.   Endo/Heme/Allergies: Negative.   Psychiatric/Behavioral: Negative.       Physical Examination  Vitals:   05/09/21 0409 05/09/21  0546  BP: 106/70 122/75  Pulse: 65 82  Resp: 17 17  Temp: 98.1 F (36.7 C)   SpO2: 100% 100%   Body mass index is 18.12 kg/m.  General: No acute distress Pulmonary: normal non-labored breathing Cardiac: Palpable right ulnar pulse, weakly palpable right radial pulse Abdomen: soft, NT/ND, no masses Extremities: Right upper extremity does have some swelling with prominent veins in the forearm although is not tight no evidence of compartment Neurologic: A&O X 3; Appropriate Affect ; SENSATION: normal; MOTOR FUNCTION:  moving all extremities equally. Speech is fluent/normal   CBC    Component Value Date/Time   WBC 8.9 05/08/2021 1353   RBC  4.67 05/08/2021 1353   HGB 14.1 05/08/2021 1353   HCT 41.7 05/08/2021 1353   PLT 214 05/08/2021 1353   MCV 89.3 05/08/2021 1353   MCH 30.2 05/08/2021 1353   MCHC 33.8 05/08/2021 1353   RDW 11.6 05/08/2021 1353   LYMPHSABS 1.4 05/08/2021 1353   MONOABS 0.5 05/08/2021 1353   EOSABS 0.0 05/08/2021 1353   BASOSABS 0.0 05/08/2021 1353    BMET    Component Value Date/Time   NA 135 05/08/2021 1353   K 4.1 05/08/2021 1353   CL 107 05/08/2021 1353   CO2 18 (L) 05/08/2021 1353   GLUCOSE 96 05/08/2021 1353   BUN 9 05/08/2021 1353   CREATININE 0.72 05/08/2021 1353   CALCIUM 8.6 (L) 05/08/2021 1353   GFRNONAA >60 05/08/2021 1353   GFRAA >60 05/19/2016 0122    COAGS: No results found for: INR, PROTIME   Non-Invasive Vascular Imaging:   CT RUE IMPRESSION: 1. 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 The remainder of the upper extremity vasculature from the axillary artery to the palmar arch on the ulnar side appears patent. 2. Mild edema within the subcutaneous soft tissues of the upper extremity without focal fluid collection.   RUE dvt study Right Findings:  +----------+------------+---------+-----------+----------+-----------------  ----+   RIGHT      Compressible Phasicity Spontaneous Properties        Summary           +----------+------------+---------+-----------+----------+-----------------  ----+   IJV            Full        Yes        Yes                                         +----------+------------+---------+-----------+----------+-----------------  ----+   Subclavian     None        No         No                         Acute              +----------+------------+---------+-----------+----------+-----------------  ----+   Axillary     Partial       No         No                 continuous  waveform,  acute non  occlusive    +----------+------------+---------+-----------+----------+-----------------  ----+   Brachial       Full        No         No                 could not get  Doppler                                                                     flow             +----------+------------+---------+-----------+----------+-----------------  ----+   Radial         Full                                                               +----------+------------+---------+-----------+----------+-----------------  ----+   Ulnar          Full                                                               +----------+------------+---------+-----------+----------+-----------------  ----+   Cephalic       Full                                                               +----------+------------+---------+-----------+----------+-----------------  ----+   Basilic        Full                                                               +----------+------------+---------+-----------+----------+-----------------  ----+      Left Findings:  +----------+------------+---------+-----------+----------+-------+   LEFT       Compressible Phasicity Spontaneous Properties Summary   +----------+------------+---------+-----------+----------+-------+   Subclavian                 Yes        Yes                          +----------+------------+---------+-----------+----------+-------+      Summary:     Right:  No evidence of superficial vein thrombosis in the upper extremity.  Findings  consistent with acute deep vein thrombosis involving the right axillary vein and  right subclavian vein.     Left:  No evidence of thrombosis in the subclavian.     ASSESSMENT/PLAN:  This is a 26 y.o. 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  We will plan for right upper extremity venogram with possible suction versus mechanical thrombectomy today in the Cath Lab.  Agree with starting heparin and continue throughout the procedure.  Please keep patient NPO.  I discussed with the patient likely need for subsequent first rib resection in the coming weeks.  She demonstrates good understanding all questions were answered.  Mckinzie Saksa C. Donzetta Matters, MD [facility] Office: [phone] Pager: [phone]

## 2021-05-09 NOTE — Progress Notes (Signed)
ANTICOAGULATION CONSULT NOTE - Initial Consult  Pharmacy Consult for heparin Indication:  DVT  Allergies  Allergen Reactions   Amitriptyline     Other reaction(s): dizziness   Cymbalta [Duloxetine Hcl]    Duloxetine Other (See Comments)    Panic attacks    Patient Measurements: Height: 5' 5.5" (166.4 cm) Weight: 50.2 kg (110 lb 9.6 oz) IBW/kg (Calculated) : 58.15  Vital Signs: Temp: 98.1 F (36.7 C) (01/10 0409) BP: 122/75 (01/10 0546) Pulse Rate: 82 (01/10 0546)  Labs: Recent Labs    05/08/21 1353  HGB 14.1  HCT 41.7  PLT 214  CREATININE 0.72    Estimated Creatinine Clearance: 83.7 mL/min (by C-G formula based on SCr of 0.72 mg/dL).   Medical History: Past Medical History:  Diagnosis Date   Anxiety    Depression     Assessment: 25yo female sent to ED by outpt clinic for concern for RUE, [place] confirms thrombus, to begin heparin.  Goal of Therapy:  Heparin level 0.3-0.7 units/ml Monitor platelets by anticoagulation protocol: Yes   Plan:  Heparin 4000 units IV bolus x1 followed by infusion at 900 units/hr and monitor heparin levels and CBC.  Vernard Gambles, PharmD, BCPS  05/09/2021,6:15 AM

## 2021-05-09 NOTE — ED Notes (Signed)
Pt signed consent witnessed by this RN. Dr.Brabham at bedside previously and explained to pt risks and benefits of surgery. Will continue to monitor.

## 2021-05-09 NOTE — Interval H&P Note (Signed)
History and Physical Interval Note:  05/09/2021 1:52 PM  Candice Cruz  has presented today for surgery, with the diagnosis of dvt.  The various methods of treatment have been discussed with the patient and family. After consideration of risks, benefits and other options for treatment, the patient has consented to  Procedure(s): UPPER EXTREMITY VENOGRAPHY (N/A) as a surgical intervention.  The patient's history has been reviewed, patient examined, no change in status, stable for surgery.  I have reviewed the patient's chart and labs.  Questions were answered to the patient's satisfaction.     Durene Cal

## 2021-05-09 NOTE — Progress Notes (Signed)
Admission from the cath lab by bed awake and alert. Right brachial dressing dry and intact, instructed  to avoid bending right  arm. Affected arm elevated with pillow.

## 2021-05-09 NOTE — Op Note (Signed)
Patient name: Candice Cruz MRN: [id number] DOB: 05/08/1995 Sex: female  05/09/2021 Pre-operative Diagnosis: Right arm DVT Post-operative diagnosis:  Same Surgeon:  Durene Cal Procedure Performed:  1.  Ultrasound-guided access, right brachial vein  2.  IVUS (intravascular ultrasound) of the right axillary, subclavian, brachiocephalic vein, and superior vena cava  3.  Mechanical thrombectomy of the right axillary, subclavian, and brachiocephalic vein  4.  Balloon venoplasty of the right axillary, subclavian, and brachiocephalic vein  5.  Right upper extremity and central venogram  6.  Conscious sedation, 67 minutes     Indications: This is a 26 year old female who presented to the emergency department and was diagnosed with a right upper extremity DVT.  She comes in today for further evaluation and possible thrombectomy.  Procedure:  The patient was identified in the holding area and taken to room 8.  The patient was then placed supine on the table and prepped and draped in the usual sterile fashion.  A time out was called.  Conscious sedation was administered with the use of IV fentanyl and Versed under continuous physician and nurse monitoring.  Heart rate, blood pressure, and oxygen saturation were continuously monitored.  Total sedation time was 67 minutes.  Ultrasound was used to evaluate the right brachial vein which was widely patent and easily compressible.  1% lidocaine was used for local anesthesia.  The right brachial vein was then cannulated with ultrasound guidance using micropuncture needle.  A [place] wire was inserted without resistance and a micropuncture sheath was placed.  A puff of contrast was then inserted to confirm that I was within the vein.  Next a Bentson wire was placed followed by an 8 [place] sheath.  The patient was fully heparinized.  Heparin levels were monitored with ACT's.  Using a Berenstein 2 catheter and a Glidewire advantage, wire access was obtained  into the inferior vena cava.  Intravascular ultrasound was used to evaluate the right axillary, subclavian, and brachiocephalic veins as well as the superior vena cava.  This showed thrombus within these veins.  There also appeared to be significant narrowing under the clavicle.  The IVUS catheter was removed and the 8 [place] Inari INTHRILL sheath was placed.  The device was then inserted into the superior vena cava.  Mechanical thrombectomy was then performed of the right brachiocephalic, subclavian, and axillary vein.  3 passes were made and small amounts of thrombus were evacuated.  I then inserted a 8 x 80 Mustang balloon and performed balloon venoplasty of the brachiocephalic, subclavian, and axillary veins.  A right upper extremity and central venogram was then performed which showed significant collaterals up around the shoulder with residual thrombus and filling defects within the brachiocephalic subclavian and axillary veins.  I then upsized to a 10 x 60 Mustang balloon and repeated balloon venoplasty of the brachiocephalic, subclavian and axillary veins.  There was a stenosis present under the clavicle.  This was at the thoracic outlet.  A completion venogram was then performed.  This showed significant decrease appearance of the venous collaterals as well as inline flow into the superior vena cava without significant narrowing.  I was satisfied with these results.  The device was then removed.  I removed the sheath and closed the cannulation site with a suture over top of a rubber bumper.  There were no immediate complications.  Impression:  #1  Successful mechanical thrombectomy and subsequent venoplasty of the right brachiocephalic, subclavian, and axillary vein with restoration of inline flow back  to the heart.  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  The patient will be considered for first rib resection.  She will be admitted for anticoagulation and likely discharge  tomorrow with outpatient surgical plans.   Juleen China, M.D., [facility] [place] Office: [phone] Pager:  [phone]

## 2021-05-09 NOTE — ED Provider Notes (Signed)
[facility] EMERGENCY DEPARTMENT Provider Note   CSN: [id number] Arrival date & time: 05/08/21  1315     History  Chief Complaint  Patient presents with   Arm Swelling    Candice Cruz is a 26 y.o. female.  HPI  26 year old female presenting to the emergency department with acute onset of right arm swelling and color change.  The patient states that she thought she slept funny on her right arm.  She woke this morning with a right arm that was swollen, with decreased sensation and discolored and purple.  Symptoms have persisted throughout the day.  She denies any chest pain or shortness of breath.  She denies any history of blood clots in her legs arms or lungs.  She denies any history of blood clotting disorder.  She is not on estrogen containing birth control pills.  Home Medications Prior to Admission medications   Medication Sig Start Date End Date Taking? Authorizing Provider  Fexofenadine HCl (ALLEGRA PO) Take 1 tablet by mouth daily as needed (allergies).   Yes [provider]  gabapentin (NEURONTIN) 400 MG capsule Take 400 mg by mouth daily as needed (pain). 03/28/21  Yes [provider]  GARLIC PO Take 1 capsule by mouth daily.   Yes [provider]  Multiple Vitamins-Minerals (ONE-A-DAY WOMENS) tablet Take 1 tablet by mouth daily.   Yes [provider]  oxyCODONE (OXY IR/ROXICODONE) 5 MG immediate release tablet Take 0.5-1 tablets (2.5-5 mg total) by mouth every 6 (six) hours as needed for severe pain. Patient not taking: Reported on 05/09/2021 05/21/16   Adam Phenix, PA-C      Allergies    Amitriptyline, Cymbalta [duloxetine hcl], and Duloxetine    Review of Systems   Review of Systems  Physical Exam Updated Vital Signs BP 122/75 (BP Location: Left Arm)    Pulse 82    Temp 98.1 F (36.7 C)    Resp 17    Ht 5' 5.5" (1.664 m)    Wt 50.2 kg    LMP 04/15/2021 (Exact Date)    SpO2 100%    BMI 18.12 kg/m   Physical Exam Vitals and nursing note reviewed.  Constitutional:      General: She is not in acute distress. HENT:     Head: Normocephalic and atraumatic.  Eyes:     Conjunctiva/sclera: Conjunctivae normal.     Pupils: Pupils are equal, round, and reactive to light.  Cardiovascular:     Rate and Rhythm: Normal rate and regular rhythm.  Pulmonary:     Effort: Pulmonary effort is normal. No respiratory distress.  Abdominal:     General: There is no distension.     Tenderness: There is no guarding.  Musculoskeletal:        General: Swelling present. No deformity or signs of injury.     Cervical back: Neck supple.     Comments: Right upper extremity with diffuse swelling, some purplish color change/discoloration noted, 1+ radial pulses  Skin:    Findings: No lesion or rash.  Neurological:     General: No focal deficit present.     Mental Status: She is alert. Mental status is at baseline.    ED Results / Procedures / Treatments   Labs (all labs ordered are listed, but only abnormal results are displayed) Labs Reviewed  COMPREHENSIVE METABOLIC PANEL - Abnormal; Notable for the following components:      Result Value   CO2 18 (*)  Calcium 8.6 (*)    Total Bilirubin 1.8 (*)    All other components within normal limits  CBC WITH DIFFERENTIAL/PLATELET  HEPARIN LEVEL (UNFRACTIONATED)  I-STAT BETA HCG BLOOD, ED ([facility], [facility], [facility] ONLY)    EKG None  Radiology CT Angio Chest PE W and/or Wo Contrast  Result Date: 05/09/2021 CLINICAL DATA:  26 year old female with history of deep venous thrombosis in the right axillary through subclavian vein. Suspected pulmonary embolism. EXAM: CT ANGIOGRAPHY CHEST WITH CONTRAST TECHNIQUE: Multidetector CT imaging of the chest was performed using the standard protocol during bolus administration of intravenous contrast. Multiplanar CT image reconstructions and MIPs were obtained to evaluate the vascular anatomy. CONTRAST:  65mL OMNIPAQUE IOHEXOL 350  MG/ML SOLN COMPARISON:  No priors. FINDINGS: Cardiovascular: There are no filling defects within the pulmonary arterial tree to suggest pulmonary embolism. Heart size is normal. There is no significant pericardial fluid, thickening or pericardial calcification. No atherosclerotic calcifications are noted in the thoracic aorta or the coronary arteries. Mediastinum/Nodes: No pathologically enlarged mediastinal or hilar lymph nodes. Esophagus is unremarkable in appearance. No axillary lymphadenopathy. Lungs/Pleura: No suspicious appearing pulmonary nodules or masses are noted. No acute consolidative airspace disease. No pleural effusions. Upper Abdomen: Unremarkable. Musculoskeletal: There are no aggressive appearing lytic or blastic lesions noted in the visualized portions of the skeleton. Review of the MIP images confirms the above findings. IMPRESSION: 1. No evidence of pulmonary embolism. 2. No acute findings are noted in the thorax. Electronically Signed   By: Trudie Reed M.D.   On: 05/09/2021 05:30   CT ANGIO UP EXTREM RIGHT W &/OR WO CONTRAST  Result Date: 05/08/2021 CLINICAL DATA:  Arm swelling numbness and discoloration, decreased radial pulse. EXAM: CT ANGIOGRAPHY OF THE right upperEXTREMITY TECHNIQUE: Multidetector CT imaging of the right upperwas performed using the standard protocol during bolus administration of intravenous contrast. Multiplanar CT image reconstructions and MIPs were obtained to evaluate the vascular anatomy. CONTRAST:  54mL OMNIPAQUE IOHEXOL 350 MG/ML SOLN COMPARISON:  None. FINDINGS: Vascular: The right axillary and brachial arteries are patent and without stenosis dissection or occlusion. There is patency of the ulnar artery to the superficial palmar arch. Direct origin of the radial artery from the axillary artery which is patent to the mid forearm. No contrast enhancement visualized in the expected location of distal radial artery at the wrist or palmar arch. Nonvascular: No  acute fracture or malalignment of the upper extremity. No suspicious axillary adenopathy. Subcutaneous soft tissues are unremarkable. Review of the MIP images confirms the above findings. IMPRESSION: 1. 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 The remainder of the upper extremity vasculature from the axillary artery to the palmar arch on the ulnar side appears patent. 2. Mild edema within the subcutaneous soft tissues of the upper extremity without focal fluid collection. Electronically Signed   By: Jasmine Pang M.D.   On: 05/08/2021 19:28   VAS [place] UPPER EXTREMITY VENOUS DUPLEX  Result Date: 05/08/2021 UPPER VENOUS STUDY  Patient Name:  Candice Cruz  Date of Exam:   05/08/2021 Medical Rec #: [id number]         Accession #:    [id number] Date of Birth: 07-06-1995  Patient Gender: F Patient Age:   10 years Exam Location:  [facility] Procedure:      VAS [place] UPPER EXTREMITY VENOUS DUPLEX Referring Phys: Parke Poisson --------------------------------------------------------------------------------  Indications: Swelling, and Numbness of forearm Comparison Study: No prior study Performing Technologist: Sherren Kerns RVS  Examination Guidelines: A complete evaluation includes B-mode imaging, spectral Doppler, color Doppler, and power Doppler as needed of all accessible portions of each vessel. Bilateral testing is considered an integral part of a complete examination. Limited examinations for reoccurring indications may be performed as noted.  Right Findings: +----------+------------+---------+-----------+----------+---------------------+  RIGHT       Compressible Phasicity Spontaneous Properties        Summary         +----------+------------+---------+-----------+----------+---------------------+  IJV            Full        Yes        Yes                                       +----------+------------+---------+-----------+----------+---------------------+  Subclavian     None        No         No                         Acute          +----------+------------+---------+-----------+----------+---------------------+  Axillary     Partial       No         No                 continuous waveform,                                                              acute non occlusive   +----------+------------+---------+-----------+----------+---------------------+  Brachial       Full        No         No                 could not get Doppler                                                                    flow           +----------+------------+---------+-----------+----------+---------------------+  Radial         Full                                                             +----------+------------+---------+-----------+----------+---------------------+  Ulnar          Full                                                             +----------+------------+---------+-----------+----------+---------------------+  Cephalic       Full                                                             +----------+------------+---------+-----------+----------+---------------------+  Basilic        Full                                                             +----------+------------+---------+-----------+----------+---------------------+  Left Findings: +----------+------------+---------+-----------+----------+-------+  LEFT       Compressible Phasicity Spontaneous Properties Summary  +----------+------------+---------+-----------+----------+-------+  Subclavian                 Yes        Yes                          +----------+------------+---------+-----------+----------+-------+  Summary:  Right: No evidence of superficial vein thrombosis in the upper extremity. Findings consistent with acute deep vein thrombosis involving the right axillary vein and right subclavian vein.  Left: No evidence of thrombosis in the subclavian.  *See table(s) above for measurements and observations.  Diagnosing physician: Lemar LivingsBrandon Cain MD Electronically signed by Lemar LivingsBrandon Cain MD on 05/08/2021 at 6:04:52 PM.    Final     Procedures Procedures    Medications Ordered in ED Medications  heparin bolus via infusion 4,000 Units (has no administration in time range)  heparin ADULT infusion 100 units/mL (25000 units/26650mL) (has no administration in time range)  iohexol (OMNIPAQUE) 350 MG/ML injection 80 mL (80 mLs Intravenous Contrast Given 05/08/21 1800)  iohexol (OMNIPAQUE) 350 MG/ML injection 47 mL (47 mLs Intravenous Contrast Given 05/09/21 0515)    ED Course/ Medical Decision Making/ A&P                           Medical Decision Making  26 year old female presenting to the emergency department with acute onset of right arm swelling and color change.  The patient states that she thought she slept funny on her right arm.  She woke this morning with a right arm that was swollen, with decreased sensation and discolored and purple.  Symptoms have persisted throughout the day.  She denies any chest pain or shortness of breath.  She denies any history of blood clots in her legs arms or lungs.  She denies any history of blood clotting disorder.  She is not on estrogen containing birth control pills.  On arrival, the patient was afebrile, hemodynamically stable, saturating well on room air.  Physical exam concerning for potential DVT.  The patient denies any pain in her hand on the right.  On exam, the patient has a palpable radial pulse but it is admittedly diminished on the right compared to the left.  Unless concern for acute limb  ischemia at this time and more concern for potential thoracic outlet syndrome.  The patient underwent DVT ultrasound imaging which did show an acute DVT of the subclavian vein with some involvement of the axillary vein which was noted to be partially occlusive.  The patient denies any  chest pain or shortness of breath and I have lower suspicion for acute PE.  A CTA PE study was ordered to evaluate for potential extension from the axillary vein into the superior vena cava.  There was no evidence of this on CT imaging.  Additionally, given the diminished pulses, a CTA of the right arm was ordered from triage which did not show evidence of acute vascular cut off.  Per the read, apparent high origin of the radial artery from the axillary artery is somewhat diminutive appearing radial artery. this vessel is patent to the level of the mid forearm after which there is no significant flow enhancement which is probably related to contrast timing/inadequate opacification but patency of the distal vessel cannot be established on this exam.  There was patency of the distal vessel as appreciated on my exam with a light and more faint but palpable radial pulse.  Mild edema within the subcutaneous soft tissues of the upper extremity without evidence of focal fluid collection likely related to the patient's DVT.  Findings are concerning for potential thoracic outlet syndrome resulting in primary spontaneous DVT of the subclavian vein.  Following this work-up, I did consult with Dr. Randie Heinzain of vascular surgery to evaluate the patient and make further recommendations. A heparin GTT was ordered while the patient awaited consultation.   Signout given to Dr. Wilkie AyeHorton at 0700.  Final Clinical Impression(s) / ED Diagnoses Final diagnoses:  Acute deep vein thrombosis (DVT) of non-extremity vein    Rx / DC Orders ED Discharge Orders     None         Ernie AvenaLawsing, Artemio Dobie, MD 05/09/21 [phone]

## 2021-05-10 ENCOUNTER — Other Ambulatory Visit (HOSPITAL_COMMUNITY): Payer: Self-pay

## 2021-05-10 ENCOUNTER — Encounter (HOSPITAL_COMMUNITY): Payer: Self-pay | Admitting: Surgery

## 2021-05-10 LAB — CBC
HCT: 35.1 % — ABNORMAL LOW (ref 36.0–46.0)
Hemoglobin: 11.6 g/dL — ABNORMAL LOW (ref 12.0–15.0)
MCH: 29.5 pg (ref 26.0–34.0)
MCHC: 33 g/dL (ref 30.0–36.0)
MCV: 89.3 fL (ref 80.0–100.0)
Platelets: 202 10*3/uL (ref 150–400)
RBC: 3.93 MIL/uL (ref 3.87–5.11)
RDW: 11.7 % (ref 11.5–15.5)
WBC: 10.9 10*3/uL — ABNORMAL HIGH (ref 4.0–10.5)
nRBC: 0 % (ref 0.0–0.2)

## 2021-05-10 MED ORDER — RIVAROXABAN 20 MG PO TABS: 20.0000 mg | ORAL_TABLET | Freq: Every day | ORAL | 11 refills | Status: AC

## 2021-05-10 MED ORDER — RIVAROXABAN (XARELTO) VTE STARTER PACK (15 & 20 MG): ORAL_TABLET | ORAL | 0 refills | Status: DC

## 2021-05-10 MED FILL — Midazolam HCl Inj 5 MG/5ML (Base Equivalent): INTRAMUSCULAR | Qty: 2 | Status: AC

## 2021-05-10 NOTE — Progress Notes (Signed)
Discharged home accompanied by mother, d/c instructions given to pt. Belongings taken home.

## 2021-05-10 NOTE — Progress Notes (Signed)
Sutures to right ac surgical site removed, tolerated well. Minimal bleeding noted, controlled with pressure dressing. Site is clean and dry.

## 2021-05-10 NOTE — Progress Notes (Addendum)
[facility]  Subjective  - Left hand is numb feeling, doesn't feel like the swelling has gone down much.     Assessment/Planning: POD # 1 Right UE venogram with  Mechanical thrombectomy of the right axillary, subclavian, and brachiocephalic vein, Balloon venoplasty of the right axillary, subclavian, and brachiocephalic vein.    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  The patient will be considered for first rib resection. She was started on Xarelto for anticoagulation.  DR. Myra Gianotti will discuss next step in her care this am.    Objective (!) 101/59 72 97.7 F (36.5 C) (Oral) 19 100%  Intake/Output Summary (Last 24 hours) at 05/10/2021 0722 Last data filed at 05/10/2021 0300 Gross per 24 hour  Intake 1460 ml  Output 0 ml  Net 1460 ml    Right UE edema, motor intact with good grip, palpable radial pulse, decreased sensation in hand No ischemic changes to right UE Lungs non labored breathing Heart RRR    Mosetta Pigeon 05/10/2021 7:22 AM --  Laboratory Lab Results: Recent Labs    05/08/21 1353 05/10/21 0111  WBC 8.9 10.9*  HGB 14.1 11.6*  HCT 41.7 35.1*  PLT 214 202   BMET Recent Labs    05/08/21 1353  NA 135  K 4.1  CL 107  CO2 18*  GLUCOSE 96  BUN 9  CREATININE 0.72  CALCIUM 8.6*    COAG No results found for: INR, PROTIME No results found for: PTT  I agree with the above Still with right arm swelling/.  Palpable right ulnar pilse We discussed scheduling right first rib resection.  We discussed riskjs of the surgery including injury to the brachial plexus, and artery and vein.  We discussed numbness and PTX.  She is travelling the next 2 weeks and so she will be scheduled for first rib resection and venoplasty in 3 weekswhen she returns.  She will remain on anticoagulation

## 2021-05-10 NOTE — TOC Initial Note (Signed)
Transition of Care [facility]) - Initial/Assessment Note    Patient Details  Name: Candice Cruz MRN: [id number] Date of Birth: 05-Oct-1995  [facility] CM/SW Contact:    Beckie Busing, RN Phone Number:[phone]  05/10/2021, 9:39 AM  Clinical Narrative:                 [facility] consulted for patient that has questions about financial assistance. CM at bedside patient inquired isf CM is able to provide itemized receipts for future visits. CM explained to patient that CM is unable to provide itemized receipts. Patient expresses concern that she would like to see the bill prior to services. CM has explained that hospital can not provide a bill prior to services. CM has referred patient to billing for any questions in regards to the billing process.   Patient states that she is from home with family where she functions independently with no DME or other needs Patient has PCP that she follows and uses [facility] [employer] for and medication needs.   No other needs noted at this time. TOC will sign off.    Expected Discharge Plan: Home/Self Care Barriers to Discharge: No Barriers Identified   Patient Goals and CMS Choice Patient states their goals for this hospitalization and ongoing recovery are:: Ready to go home CMS Medicare.gov Compare Post Acute Care list provided to::  (n/a) Choice offered to / list presented to : NA  Expected Discharge Plan and Services Expected Discharge Plan: Home/Self Care In-house Referral: NA Discharge Planning Services: CM Consult   Living arrangements for the past 2 months:  (n/a) Expected Discharge Date: 05/10/21               DME Arranged: N/A DME Agency: NA       HH Arranged: NA          Prior Living Arrangements/Services Living arrangements for the past 2 months:  (n/a) Lives with:: Relatives Patient language and need for interpreter reviewed:: Yes Do you feel safe going back to the place where you live?: Yes      Need for  Family Participation in Patient Care: Yes (Comment) Care giver support system in place?: Yes (comment) Current home services:  (n/a) Criminal Activity/Legal Involvement Pertinent to Current Situation/Hospitalization: No - Comment as needed  Activities of Daily Living Home Assistive Devices/Equipment: None ADL Screening (condition at time of admission) Patient's cognitive ability adequate to safely complete daily activities?: Yes Is the patient deaf or have difficulty hearing?: No Does the patient have difficulty seeing, even when wearing glasses/contacts?: No Does the patient have difficulty concentrating, remembering, or making decisions?: No Patient able to express need for assistance with ADLs?: Yes Does the patient have difficulty dressing or bathing?: No Independently performs ADLs?: Yes (appropriate for developmental age) Does the patient have difficulty walking or climbing stairs?: No Weakness of Legs: None Weakness of Arms/Hands: None  Permission Sought/Granted   Permission granted to share information with : No              Emotional Assessment Appearance:: Appears stated age Attitude/Demeanor/Rapport: Gracious Affect (typically observed): Pleasant Orientation: : Oriented to Self, Oriented to Place, Oriented to  Time, Oriented to Situation Alcohol / Substance Use: Not Applicable Psych Involvement: No (comment)  Admission diagnosis:  Acute deep vein thrombosis (DVT) of non-extremity vein [I82.90] DVT (deep venous thrombosis) (HCC) [I82.409] Patient Active Problem List   Diagnosis Date Noted   DVT (deep venous thrombosis) (HCC) 05/09/2021   Acute appendicitis  05/19/2016   PCP:  Farris Has, MD Pharmacy:   [facility] [address] [place]), [place] - [address] [place] [address] Phone: [phone] Fax: [phone]     Social Determinants of Health (SDOH) Interventions    Readmission Risk  Interventions No flowsheet data found.

## 2021-05-10 NOTE — Discharge Instructions (Addendum)
Information on my medicine - XARELTO (rivaroxaban)  This medication education was reviewed with me or my healthcare representative as part of my discharge preparation.  The pharmacist that spoke with me during my hospital stay was:  Georgina Peer, [place] FOR YOU? Xarelto was prescribed to treat blood clots that may have been found in the veins of your legs (deep vein thrombosis) or in your lungs (pulmonary embolism) and to reduce the risk of them occurring again.  What do you need to know about Xarelto? The starting dose is one 15 mg tablet taken TWICE daily with food for the FIRST 21 DAYS then on 05/30/21 the dose is changed to one 20 mg tablet taken ONCE A DAY with your evening meal.  DO NOT stop taking Xarelto without talking to the health care provider who prescribed the medication.  Refill your prescription for 20 mg tablets before you run out.  After discharge, you should have regular check-up appointments with your healthcare provider that is prescribing your Xarelto.  In the future your dose may need to be changed if your kidney function changes by a significant amount.  What do you do if you miss a dose? If you are taking Xarelto TWICE DAILY and you miss a dose, take it as soon as you remember. You may take two 15 mg tablets (total 30 mg) at the same time then resume your regularly scheduled 15 mg twice daily the next day.  If you are taking Xarelto ONCE DAILY and you miss a dose, take it as soon as you remember on the same day then continue your regularly scheduled once daily regimen the next day. Do not take two doses of Xarelto at the same time.   Important Safety Information Xarelto is a blood thinner medicine that can cause bleeding. You should call your healthcare provider right away if you experience any of the following: Bleeding from an injury or your nose that does not stop. Unusual colored urine (red or dark brown) or unusual colored  stools (red or black). Unusual bruising for unknown reasons. A serious fall or if you hit your head (even if there is no bleeding).  Some medicines may interact with Xarelto and might increase your risk of bleeding while on Xarelto. To help avoid this, consult your healthcare provider or pharmacist prior to using any new prescription or non-prescription medications, including herbals, vitamins, non-steroidal anti-inflammatory drugs (NSAIDs) and supplements.  This website has more information on Xarelto: [email or web address]

## 2021-05-10 NOTE — TOC Benefit Eligibility Note (Signed)
Patient Advocate Encounter  Prior Authorization for Xarelto 20 mg has been approved.    PA# [id number] Effective dates: 05/10/2021 through 05/10/2022  Patients co-pay is $30.00.     Lyndel Safe, [place] Patient Advocate Specialist [place] Patient Advocate Team Direct Number: [phone]  Fax: [phone]

## 2021-05-11 ENCOUNTER — Encounter (HOSPITAL_COMMUNITY): Payer: Self-pay | Admitting: Surgery

## 2021-05-15 ENCOUNTER — Other Ambulatory Visit: Payer: Self-pay

## 2021-05-15 DIAGNOSIS — I871 Compression of vein: Secondary | ICD-10-CM

## 2021-05-25 ENCOUNTER — Telehealth: Payer: Self-pay

## 2021-05-25 NOTE — Telephone Encounter (Signed)
Patient wants to know how long after her rib resection she should wait to start her new job.  I advised her 4-6 wks due to the type surgery she is having.  Patient verbalized understanding.

## 2021-05-29 NOTE — Progress Notes (Signed)
Surgical Instructions    Your procedure is scheduled on Friday, February 3rd, 2023.   Report to [facility] Main Entrance "A" at 08:00 A.M., then check in with the Admitting office.  Call this number if you have problems the morning of surgery:  [phone]   If you have any questions prior to your surgery date call [phone]: Open Monday-Friday 8am-4pm    Remember:  Do not eat or drink after midnight the night before your surgery     Take these medicines the morning of surgery with A SIP OF WATER:  gabapentin (NEURONTIN) - as needed Hyprom-Naphaz-Polysorb-Zn Sulf (CLEAR EYES COMPLETE) SOLN - as needed   Follow your surgeon's instructions on when to stop Xarelto. If no instructions were given by your surgeon then you will need to call the office to get those instructions.     As of today, STOP taking any Aspirin (unless otherwise instructed by your surgeon) Aleve, Naproxen, Ibuprofen, Motrin, Advil, Goody's, BC's, all herbal medications, fish oil, and all vitamins.   After your COVID test   You are not required to quarantine however you are required to wear a well-fitting mask when you are out and around people not in your household.  If your mask becomes wet or soiled, replace with a new one.  Wash your hands often with soap and water for 20 seconds or clean your hands with an alcohol-based hand sanitizer that contains at least 60% alcohol.  Do not share personal items.  Notify your provider: if you are in close contact with someone who has COVID  or if you develop a fever of 100.4 or greater, sneezing, cough, sore throat, shortness of breath or body aches.    The day of surgery:          Do not wear jewelry or makeup Do not wear lotions, powders, perfumes, or deodorant. Do not shave 48 hours prior to surgery.   Do not bring valuables to the hospital. Do not wear nail polish, gel polish, artificial nails, or any other type of covering on natural nails (fingers and  toes) If you have artificial nails or gel coating that need to be removed by a nail salon, please have this removed prior to surgery. Artificial nails or gel coating may interfere with anesthesia's ability to adequately monitor your vital signs.              [facility] is not responsible for any belongings or valuables.  Do NOT Smoke (Tobacco/Vaping)  24 hours prior to your procedure  If you use a CPAP at night, you may bring your mask for your overnight stay.   Contacts, glasses, hearing aids, dentures or partials may not be worn into surgery, please bring cases for these belongings   For patients admitted to the hospital, discharge time will be determined by your treatment team.   Patients discharged the day of surgery will not be allowed to drive home, and someone needs to stay with them for 24 hours.  NO VISITORS WILL BE ALLOWED IN PRE-OP WHERE PATIENTS ARE PREPPED FOR SURGERY.  ONLY 1 SUPPORT PERSON MAY BE PRESENT IN THE WAITING ROOM WHILE YOU ARE IN SURGERY.  IF YOU ARE TO BE ADMITTED, ONCE YOU ARE IN YOUR ROOM YOU WILL BE ALLOWED TWO (2) VISITORS. 1 (ONE) VISITOR MAY STAY OVERNIGHT BUT MUST ARRIVE TO THE ROOM BY 8pm.  Minor children may have two parents present. Special consideration for safety and communication needs will be reviewed on a  case by case basis.  Special instructions:    Oral Hygiene is also important to reduce your risk of infection.  Remember - BRUSH YOUR TEETH THE MORNING OF SURGERY WITH YOUR REGULAR TOOTHPASTE   [facility] Preparing For [facility]  Before surgery, you can play an important role. Because skin is not sterile, your skin needs to be as free of germs as possible. You can reduce the number of germs on your skin by washing with CHG (chlorahexidine gluconate) Soap before surgery.  CHG is an antiseptic cleaner which kills germs and bonds with the skin to continue killing germs even after washing.     Please do not use if you have an allergy to CHG or  antibacterial soaps. If your skin becomes reddened/irritated stop using the CHG.  Do not shave (including legs and underarms) for at least 48 hours prior to first CHG shower. It is OK to shave your face.  Please follow these instructions carefully.     Shower the NIGHT BEFORE SURGERY and the MORNING OF SURGERY with CHG Soap.   If you chose to wash your hair, wash your hair first as usual with your normal shampoo. After you shampoo, rinse your hair and body thoroughly to remove the shampoo.  Then [employer] and genitals (private parts) with your normal soap and rinse thoroughly to remove soap.  After that Use CHG Soap as you would any other liquid soap. You can apply CHG directly to the skin and wash gently with a scrungie or a clean washcloth.   Apply the CHG Soap to your body ONLY FROM THE NECK DOWN.  Do not use on open wounds or open sores. Avoid contact with your eyes, ears, mouth and genitals (private parts). Wash Face and genitals (private parts)  with your normal soap.   Wash thoroughly, paying special attention to the area where your surgery will be performed.  Thoroughly rinse your body with warm water from the neck down.  DO NOT shower/wash with your normal soap after using and rinsing off the CHG Soap.  Pat yourself dry with a CLEAN TOWEL.  Wear CLEAN PAJAMAS to bed the night before surgery  Place CLEAN SHEETS on your bed the night before your surgery  DO NOT SLEEP WITH PETS.   Day of Surgery:  Take a shower with CHG soap. Wear Clean/Comfortable clothing the morning of surgery Do not apply any deodorants/lotions.   Remember to brush your teeth WITH YOUR REGULAR TOOTHPASTE.   Please read over the following fact sheets that you were given.

## 2021-05-30 ENCOUNTER — Telehealth: Payer: Self-pay

## 2021-05-30 ENCOUNTER — Encounter (HOSPITAL_COMMUNITY)
Admission: RE | Admit: 2021-05-30 | Discharge: 2021-05-30 | Disposition: A | Discharge status: Home / Self Care | Payer: Managed Care, Other (non HMO) | Source: Ambulatory Visit | Attending: Surgery | Admitting: Surgery

## 2021-05-30 ENCOUNTER — Other Ambulatory Visit: Payer: Self-pay

## 2021-05-30 ENCOUNTER — Encounter (HOSPITAL_COMMUNITY): Payer: Self-pay

## 2021-05-30 VITALS — BP 112/72 | HR 88 | Temp 98.2°F | Resp 17 | Ht 65.0 in | Wt 109.7 lb

## 2021-05-30 DIAGNOSIS — G540 Brachial plexus disorders: Secondary | ICD-10-CM | POA: Insufficient documentation

## 2021-05-30 DIAGNOSIS — Z20822 Contact with and (suspected) exposure to covid-19: Secondary | ICD-10-CM | POA: Insufficient documentation

## 2021-05-30 DIAGNOSIS — I82A11 Acute embolism and thrombosis of right axillary vein: Secondary | ICD-10-CM | POA: Insufficient documentation

## 2021-05-30 DIAGNOSIS — Z01818 Encounter for other preprocedural examination: Secondary | ICD-10-CM | POA: Insufficient documentation

## 2021-05-30 DIAGNOSIS — I82890 Acute embolism and thrombosis of other specified veins: Secondary | ICD-10-CM | POA: Insufficient documentation

## 2021-05-30 DIAGNOSIS — I871 Compression of vein: Secondary | ICD-10-CM

## 2021-05-30 LAB — URINALYSIS, ROUTINE W REFLEX MICROSCOPIC
Bilirubin Urine: NEGATIVE
Glucose, UA: NEGATIVE mg/dL
Hgb urine dipstick: NEGATIVE
Ketones, ur: NEGATIVE mg/dL
Leukocytes,Ua: NEGATIVE
Nitrite: NEGATIVE
Protein, ur: NEGATIVE mg/dL
Specific Gravity, Urine: <1.005 — ABNORMAL LOW (ref 1.005–1.030)
pH: 6 (ref 5.0–8.0)

## 2021-05-30 LAB — COMPREHENSIVE METABOLIC PANEL
ALT: 14 U/L (ref 0–44)
AST: 20 U/L (ref 15–41)
Albumin: 4.3 g/dL (ref 3.5–5.0)
Alkaline Phosphatase: 54 U/L (ref 38–126)
Anion gap: 8 (ref 5–15)
BUN: 6 mg/dL (ref 6–20)
CO2: 27 mmol/L (ref 22–32)
Calcium: 9.1 mg/dL (ref 8.9–10.3)
Chloride: 104 mmol/L (ref 98–111)
Creatinine, Ser: 0.74 mg/dL (ref 0.44–1.00)
GFR, Estimated: >60 mL/min (ref >60)
Glucose, Bld: 117 mg/dL — ABNORMAL HIGH (ref 70–99)
Potassium: 3.4 mmol/L — ABNORMAL LOW (ref 3.5–5.1)
Sodium: 139 mmol/L (ref 135–145)
Total Bilirubin: 1.3 mg/dL — ABNORMAL HIGH (ref 0.3–1.2)
Total Protein: 7 g/dL (ref 6.5–8.1)

## 2021-05-30 LAB — APTT: aPTT: 45 seconds — ABNORMAL HIGH (ref 24–36)

## 2021-05-30 LAB — SURGICAL PCR SCREEN

## 2021-05-30 LAB — CBC
HCT: 41.1 % (ref 36.0–46.0)
Hemoglobin: 13.6 g/dL (ref 12.0–15.0)
MCH: 29.8 pg (ref 26.0–34.0)
MCHC: 33.1 g/dL (ref 30.0–36.0)
MCV: 90.1 fL (ref 80.0–100.0)
Platelets: 263 10*3/uL (ref 150–400)
RBC: 4.56 MIL/uL (ref 3.87–5.11)
RDW: 11.7 % (ref 11.5–15.5)
WBC: 5.9 10*3/uL (ref 4.0–10.5)
nRBC: 0 % (ref 0.0–0.2)

## 2021-05-30 LAB — PROTIME-INR
INR: 2 — ABNORMAL HIGH (ref 0.8–1.2)
Prothrombin Time: 22.8 seconds — ABNORMAL HIGH (ref 11.4–15.2)

## 2021-05-30 LAB — TYPE AND SCREEN
ABO/RH(D): O POS
Antibody Screen: NEGATIVE

## 2021-05-30 LAB — SARS CORONAVIRUS 2 (TAT 6-24 HRS): SARS Coronavirus 2: NEGATIVE

## 2021-05-30 NOTE — Telephone Encounter (Signed)
Per MD, PAT Alvis Lemmings, nurse) is aware to redraw pt's labs when she arrives for surgery on Friday.

## 2021-05-30 NOTE — Progress Notes (Signed)
PCP - Farris Has, MD Cardiologist - denies  PPM/ICD - denies Device Orders - n/a Rep Notified - n/a  Chest x-ray - n/a EKG - 05/30/2021 Stress Test - denies ECHO - denies Cardiac Cath - 05/09/2021  Sleep Study - denies CPAP - n/a  Fasting Blood Sugar - n/a  Blood Thinner Instructions: Xarelto - last dose - 05/31/2021 per patient  Aspirin Instructions: Patient was instructed: As of today, STOP taking any Aspirin (unless otherwise instructed by your surgeon) Aleve, Naproxen, Ibuprofen, Motrin, Advil, Goody's, BC's, all herbal medications, fish oil, and all vitamins.  ERAS Protcol - n/a   COVID TEST- done in PAT on 05/30/2021   Anesthesia review: yes - Patient was in ED on 05/09/2021 for right arm swelling -  DVT of the subclavian vein. In PAT patient denies any discomfort  Patient denies shortness of breath, fever, cough and chest pain at PAT appointment   All instructions explained to the patient, with a verbal understanding of the material. Patient agrees to go over the instructions while at home for a better understanding. Patient also instructed to self quarantine after being tested for COVID-19. The opportunity to ask questions was provided.

## 2021-05-30 NOTE — Progress Notes (Signed)
Harriett Sine, RN from Dr. Myra Gianotti office called PAT with Dr. Myra Gianotti order for the day of surgery, to recollect PT/INR and APTT.

## 2021-05-30 NOTE — Telephone Encounter (Signed)
Candice Cruz from PAT called with out of range lab values. MD has been messaged with these.

## 2021-05-30 NOTE — Progress Notes (Signed)
Abnormal labs in PAT: PT 22.8; INR 2.0; APTT 45. Dr. Myra Gianotti office was called and a message was left to the surgical scheduler to call back PAT. Also, Dr. Myra Gianotti was notified via IMB. Dr. Randie Heinz office was called and the results were communicated to [facility].

## 2021-05-31 ENCOUNTER — Encounter (HOSPITAL_COMMUNITY): Payer: Self-pay

## 2021-05-31 NOTE — Progress Notes (Signed)
Anesthesia Chart Review:  Case: [id number] Date/Time: 06/02/21 0952   Procedures:      RIGHT FIRST RIB RESECTION (Right)     VENOPLASTY (Right)   Anesthesia type: General   Pre-op diagnosis: Venous thoracic outlet syndrome   [facility] RO[facility] 16 / [facility] OR   Surgeons: Nada Libman, MD       DISCUSSION: Patient is a 26 year old female scheduled for the above procedure. On 05/08/21 she presented with RUE swelling. Imaging showed acute DVT involving the right axillary and subclavian veins.She underwent RUE and central venogram on 05/09/21 with mechanical thrombectomy of right axillary, subclavian and brachiocephalic veins followed by balloon venoplasty of the right axillary, subclavian and brachiocephalic veins.  Findings were consistent with venous thoracic outlet syndrome.  She was started on anticoagulation therapy with anticipated first rib resection in the near future.  Other history includes never smoker, anxiety, depression, appendectomy (05/19/16).   Per [facility], she is to hold Xarelto 1 day prior to surgery. Last dose scheduled for 05/31/21.   05/30/2021 presurgical COVID-19 test negative.  PT/PTT results elevated.  Surgeon requested repeat PT/PTT on the day of surgery.  She will also need nasal PCR recollected, 2nd blood bank specimen, and a urine pregnancy test on the day of surgery.   VS: BP 112/72    Pulse 88    Temp 36.8 C (Oral)    Resp 17    Ht 5\' 5"  (1.651 m)    Wt 49.8 kg    LMP 05/13/2021    SpO2 100%    BMI 18.26 kg/m   PROVIDERS: 05/15/2021, MD is PCP    LABS: Preoperative labs noted. PT 22.8, INR 2.0, aPTT 45.  (all labs ordered are listed, but only abnormal results are displayed)  Labs Reviewed  SURGICAL PCR SCREEN - Abnormal; Notable for the following components:      Result Value   MRSA, PCR   (*)    Value: INVALID, UNABLE TO DETERMINE THE PRESENCE OF TARGET DUE TO SPECIMEN INTEGRITY. RECOLLECTION REQUESTED.   Staphylococcus aureus   (*)    Value: CRITICAL  RESULT CALLED TO, READ BACK BY AND VERIFIED WITH:   All other components within normal limits  COMPREHENSIVE METABOLIC PANEL - Abnormal; Notable for the following components:   Potassium 3.4 (*)    Glucose, Bld 117 (*)    Total Bilirubin 1.3 (*)    All other components within normal limits  PROTIME-INR - Abnormal; Notable for the following components:   Prothrombin Time 22.8 (*)    INR 2.0 (*)    All other components within normal limits  APTT - Abnormal; Notable for the following components:   aPTT 45 (*)    All other components within normal limits  URINALYSIS, ROUTINE W REFLEX MICROSCOPIC - Abnormal; Notable for the following components:   Specific Gravity, Urine <1.005 (*)    All other components within normal limits  SARS CORONAVIRUS 2 (TAT 6-24 HRS)  CBC  TYPE AND SCREEN     IMAGES: CTA Chest 05/09/21: IMPRESSION: 1. No evidence of pulmonary embolism. 2. No acute findings are noted in the thorax.  CTA RUE 05/08/21: IMPRESSION: 1. 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  The remainder of the upper extremity vasculature from the axillary artery to the palmar arch on the ulnar side appears patent. 2. Mild edema within the subcutaneous soft tissues of the upper extremity without focal fluid collection.     EKG: 05/30/21: Sinus rhythm Premature atrial complexes Nonspecific ST and T wave abnormality Abnormal ECG No previous ECGs available Confirmed by Donato Schultz ([id number]) on 05/31/2021 5:40:37 AM   CV: RUE and Central Venogram 05/09/21: Impression:            #1  Successful mechanical thrombectomy and subsequent venoplasty of the right brachiocephalic,  subclavian, and axillary vein with restoration of inline flow back to the heart.            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  The patient will be considered for first rib resection.  She will be admitted for anticoagulation and likely discharge tomorrow with outpatient surgical plans.    UE Venous Duplex 05/08/21: Summary:  - Right:  No evidence of superficial vein thrombosis in the upper extremity.  Findings  consistent with acute deep vein thrombosis involving the right axillary  vein and  right subclavian vein.   - Left:  No evidence of thrombosis in the subclavian.      Past Medical History:  Diagnosis Date   Anxiety    Depression     Past Surgical History:  Procedure Laterality Date   APPENDECTOMY     CARDIAC CATHETERIZATION     INTRAVASCULAR ULTRASOUND/IVUS Right 05/09/2021   Procedure: Intravascular Ultrasound/IVUS;  Surgeon: Nada Libman, MD;  [facility] INVASIVE CV LAB;  Service: Cardiovascular;  Laterality: Right;  upper extremity   LAPAROSCOPIC APPENDECTOMY N/A 05/19/2016   Procedure: APPENDECTOMY LAPAROSCOPIC;  Surgeon: Harriette Bouillon, MD;  Location: [facility] OR;  Service: General;  Laterality: N/A;   PERIPHERAL VASCULAR BALLOON ANGIOPLASTY Right 05/09/2021   Procedure: PERIPHERAL VASCULAR BALLOON ANGIOPLASTY;  Surgeon: Nada Libman, MD;  [facility] INV[facility] CV LAB;  Service: Cardiovascular;  Laterality: Right;  subclavian vein   PERIPHERAL VASCULAR THROMBECTOMY Right 05/09/2021   Procedure: PERIPHERAL VASCULAR THROMBECTOMY;  Surgeon: Nada Libman, MD;  [facility] INV[facility] CV LAB;  Service: Cardiovascular;  Laterality: Right;  subclavian vein   UPPER EXTREMITY VENOGRAPHY N/A 05/09/2021   Procedure: UPPER EXTREMITY VENOGRAPHY;  Surgeon: Nada Libman, MD;  [facility] INVAS[facility] CV LAB;  Service: Cardiovascular;  Laterality: N/A;    MEDICATIONS:  gabapentin (NEURONTIN) 400 MG capsule   GARLIC PO    Hyprom-Naphaz-Polysorb-Zn Sulf (CLEAR EYES COMPLETE) SOLN   Multiple Vitamins-Minerals (ONE-A-DAY WOMENS) tablet   rivaroxaban (XARELTO) 20 MG TABS tablet   RIVAROXABAN (XARELTO) VTE STARTER PACK (15 & 20 MG)   No current facility-administered medications for this encounter.    Shonna Chock, PA-C Surgical Short Stay/Anesthesiology [facility] Phone [phone] [facility] Phone [phone] 05/31/2021 12:30 PM

## 2021-05-31 NOTE — Anesthesia Preprocedure Evaluation (Addendum)
Anesthesia Evaluation  Patient identified by MRN, date of birth, ID band Patient awake    Reviewed: Allergy & Precautions, H&P , NPO status , Patient's Chart, lab work & pertinent test results  Airway Mallampati: II   Neck ROM: full    Dental   Pulmonary neg pulmonary ROS,    breath sounds clear to auscultation       Cardiovascular + DVT   Rhythm:regular Rate:Normal  Thoracic outlet syndrome   Neuro/Psych PSYCHIATRIC DISORDERS Anxiety Depression    GI/Hepatic   Endo/Other    Renal/GU      Musculoskeletal   Abdominal   Peds  Hematology   Anesthesia Other Findings   Reproductive/Obstetrics                             Anesthesia Physical Anesthesia Plan  ASA: 2  Anesthesia Plan: General   Post-op Pain Management:    Induction: Intravenous  PONV Risk Score and Plan: 3 and Ondansetron, Dexamethasone, Midazolam and Treatment may vary due to age or medical condition  Airway Management Planned: Oral ETT  Additional Equipment:   Intra-op Plan:   Post-operative Plan: Extubation in OR  Informed Consent: I have reviewed the patients History and Physical, chart, labs and discussed the procedure including the risks, benefits and alternatives for the proposed anesthesia with the patient or authorized representative who has indicated his/her understanding and acceptance.     Dental advisory given  Plan Discussed with: CRNA, Anesthesiologist and Surgeon  Anesthesia Plan Comments: (PAT note written 05/31/2021 by Shonna Chock, PA-C. )       Anesthesia Quick Evaluation

## 2021-06-02 ENCOUNTER — Other Ambulatory Visit: Payer: Self-pay

## 2021-06-02 ENCOUNTER — Inpatient Hospital Stay (HOSPITAL_COMMUNITY): Payer: Managed Care, Other (non HMO) | Admitting: Anesthesiology

## 2021-06-02 ENCOUNTER — Encounter (HOSPITAL_COMMUNITY)
Admission: RE | Disposition: A | Discharge status: Home / Self Care | Payer: Self-pay | Source: Home / Self Care | Attending: Surgery

## 2021-06-02 ENCOUNTER — Inpatient Hospital Stay (HOSPITAL_COMMUNITY)
Admission: RE | Admit: 2021-06-02 | Discharge: 2021-06-05 | DRG: 029 | Disposition: A | Discharge status: Home / Self Care | Payer: Managed Care, Other (non HMO) | Attending: Surgery | Admitting: Surgery

## 2021-06-02 ENCOUNTER — Inpatient Hospital Stay (HOSPITAL_COMMUNITY): Payer: Managed Care, Other (non HMO)

## 2021-06-02 ENCOUNTER — Inpatient Hospital Stay (HOSPITAL_COMMUNITY): Payer: Managed Care, Other (non HMO) | Admitting: Vascular Surgery

## 2021-06-02 ENCOUNTER — Encounter (HOSPITAL_COMMUNITY): Payer: Self-pay | Admitting: Surgery

## 2021-06-02 DIAGNOSIS — I82A11 Acute embolism and thrombosis of right axillary vein: Secondary | ICD-10-CM | POA: Diagnosis present

## 2021-06-02 DIAGNOSIS — M7989 Other specified soft tissue disorders: Secondary | ICD-10-CM | POA: Diagnosis present

## 2021-06-02 DIAGNOSIS — Z20822 Contact with and (suspected) exposure to covid-19: Secondary | ICD-10-CM | POA: Diagnosis present

## 2021-06-02 DIAGNOSIS — G540 Brachial plexus disorders: Secondary | ICD-10-CM

## 2021-06-02 DIAGNOSIS — Z7901 Long term (current) use of anticoagulants: Secondary | ICD-10-CM

## 2021-06-02 DIAGNOSIS — I82609 Acute embolism and thrombosis of unspecified veins of unspecified upper extremity: Secondary | ICD-10-CM | POA: Diagnosis present

## 2021-06-02 DIAGNOSIS — F32A Depression, unspecified: Secondary | ICD-10-CM | POA: Diagnosis present

## 2021-06-02 DIAGNOSIS — F419 Anxiety disorder, unspecified: Secondary | ICD-10-CM | POA: Diagnosis present

## 2021-06-02 DIAGNOSIS — Z9889 Other specified postprocedural states: Secondary | ICD-10-CM

## 2021-06-02 DIAGNOSIS — I82B11 Acute embolism and thrombosis of right subclavian vein: Secondary | ICD-10-CM | POA: Diagnosis present

## 2021-06-02 DIAGNOSIS — Z01818 Encounter for other preprocedural examination: Secondary | ICD-10-CM

## 2021-06-02 HISTORY — PX: RIB RESECTION: SHX5077

## 2021-06-02 HISTORY — PX: VENOGRAM: SHX5497

## 2021-06-02 HISTORY — PX: ULTRASOUND GUIDANCE FOR VASCULAR ACCESS: SHX6516

## 2021-06-02 LAB — CBC
HCT: 36.7 % (ref 36.0–46.0)
Hemoglobin: 12.4 g/dL (ref 12.0–15.0)
MCH: 29.8 pg (ref 26.0–34.0)
MCHC: 33.8 g/dL (ref 30.0–36.0)
MCV: 88.2 fL (ref 80.0–100.0)
Platelets: 263 10*3/uL (ref 150–400)
RBC: 4.16 MIL/uL (ref 3.87–5.11)
RDW: 11.8 % (ref 11.5–15.5)
WBC: 16.4 10*3/uL — ABNORMAL HIGH (ref 4.0–10.5)
nRBC: 0 % (ref 0.0–0.2)

## 2021-06-02 LAB — BASIC METABOLIC PANEL
Anion gap: 9 (ref 5–15)
BUN: 6 mg/dL (ref 6–20)
CO2: 20 mmol/L — ABNORMAL LOW (ref 22–32)
Calcium: 8.5 mg/dL — ABNORMAL LOW (ref 8.9–10.3)
Chloride: 105 mmol/L (ref 98–111)
Creatinine, Ser: 0.72 mg/dL (ref 0.44–1.00)
GFR, Estimated: >60 mL/min (ref >60)
Glucose, Bld: 181 mg/dL — ABNORMAL HIGH (ref 70–99)
Potassium: 3.6 mmol/L (ref 3.5–5.1)
Sodium: 134 mmol/L — ABNORMAL LOW (ref 135–145)

## 2021-06-02 LAB — PROTIME-INR
INR: 1.1 (ref 0.8–1.2)
Prothrombin Time: 14 seconds (ref 11.4–15.2)

## 2021-06-02 LAB — APTT: aPTT: 29 seconds (ref 24–36)

## 2021-06-02 LAB — POCT PREGNANCY, URINE: Preg Test, Ur: NEGATIVE

## 2021-06-02 LAB — ABO/RH: ABO/RH(D): O POS

## 2021-06-02 LAB — SURGICAL PCR SCREEN
MRSA, PCR: NEGATIVE
Staphylococcus aureus: POSITIVE — AB

## 2021-06-02 SURGERY — EXCISION, RIB
Anesthesia: General | Laterality: Right

## 2021-06-02 MED ORDER — HEMOSTATIC AGENTS (NO CHARGE) OPTIME
TOPICAL | Status: DC | PRN
  Administered 2021-06-02: 1 via TOPICAL

## 2021-06-02 MED ORDER — BISACODYL 5 MG PO TBEC: 5.0000 mg | DELAYED_RELEASE_TABLET | Freq: Every day | ORAL | Status: DC | PRN

## 2021-06-02 MED ORDER — 0.9 % SODIUM CHLORIDE (POUR BTL) OPTIME
TOPICAL | Status: DC | PRN
  Administered 2021-06-02: 1000 mL

## 2021-06-02 MED ORDER — PHENOL 1.4 % MT LIQD: 1.0000 | OROMUCOSAL | Status: DC | PRN

## 2021-06-02 MED ORDER — CHLORHEXIDINE GLUCONATE CLOTH 2 % EX PADS: 6.0000 | MEDICATED_PAD | Freq: Once | CUTANEOUS | Status: DC

## 2021-06-02 MED ORDER — ALUM & MAG HYDROXIDE-SIMETH 200-200-20 MG/5ML PO SUSP: 15.0000 mL | ORAL | Status: DC | PRN

## 2021-06-02 MED ORDER — FENTANYL CITRATE (PF) 250 MCG/5ML IJ SOLN
INTRAMUSCULAR | Status: AC
  Filled 2021-06-02: qty 5

## 2021-06-02 MED ORDER — FENTANYL CITRATE (PF) 100 MCG/2ML IJ SOLN
INTRAMUSCULAR | Status: AC
  Filled 2021-06-02: qty 2

## 2021-06-02 MED ORDER — LACTATED RINGERS IV SOLN: INTRAVENOUS | Status: DC | PRN

## 2021-06-02 MED ORDER — IODIXANOL 320 MG/ML IV SOLN
INTRAVENOUS | Status: DC | PRN
  Administered 2021-06-02: 12 mL via INTRAVENOUS

## 2021-06-02 MED ORDER — PHENYLEPHRINE HCL-NACL 20-0.9 MG/250ML-% IV SOLN
INTRAVENOUS | Status: DC | PRN
  Administered 2021-06-02: 25 ug/min via INTRAVENOUS

## 2021-06-02 MED ORDER — LABETALOL HCL 5 MG/ML IV SOLN: 10.0000 mg | INTRAVENOUS | Status: DC | PRN

## 2021-06-02 MED ORDER — SODIUM CHLORIDE 0.9 % IV SOLN: INTRAVENOUS | Status: DC

## 2021-06-02 MED ORDER — OXYCODONE-ACETAMINOPHEN 5-325 MG PO TABS
1.0000 | ORAL_TABLET | ORAL | Status: DC | PRN
  Administered 2021-06-02 – 2021-06-03 (×3): 2 via ORAL
  Filled 2021-06-02 (×3): qty 2

## 2021-06-02 MED ORDER — LIDOCAINE 2% (20 MG/ML) 5 ML SYRINGE
INTRAMUSCULAR | Status: DC | PRN
  Administered 2021-06-02: 40 mg via INTRAVENOUS

## 2021-06-02 MED ORDER — METOPROLOL TARTRATE 5 MG/5ML IV SOLN: 2.0000 mg | INTRAVENOUS | Status: DC | PRN

## 2021-06-02 MED ORDER — HYDRALAZINE HCL 20 MG/ML IJ SOLN: 5.0000 mg | INTRAMUSCULAR | Status: DC | PRN

## 2021-06-02 MED ORDER — OXYCODONE HCL 5 MG PO TABS
5.0000 mg | ORAL_TABLET | Freq: Once | ORAL | Status: AC | PRN
  Administered 2021-06-02: 5 mg via ORAL

## 2021-06-02 MED ORDER — AMISULPRIDE (ANTIEMETIC) 5 MG/2ML IV SOLN
10.0000 mg | Freq: Once | INTRAVENOUS | Status: AC
  Administered 2021-06-02: 10 mg via INTRAVENOUS

## 2021-06-02 MED ORDER — SUGAMMADEX SODIUM 200 MG/2ML IV SOLN
INTRAVENOUS | Status: DC | PRN
  Administered 2021-06-02: 200 mg via INTRAVENOUS

## 2021-06-02 MED ORDER — SODIUM CHLORIDE 0.9 % IV SOLN: 250.0000 mL | INTRAVENOUS | Status: DC | PRN

## 2021-06-02 MED ORDER — AMISULPRIDE (ANTIEMETIC) 5 MG/2ML IV SOLN
INTRAVENOUS | Status: AC
  Filled 2021-06-02: qty 4

## 2021-06-02 MED ORDER — ACETAMINOPHEN 325 MG RE SUPP
325.0000 mg | RECTAL | Status: DC | PRN
  Filled 2021-06-02: qty 2

## 2021-06-02 MED ORDER — MIDAZOLAM HCL 2 MG/2ML IJ SOLN
INTRAMUSCULAR | Status: AC
  Filled 2021-06-02: qty 2

## 2021-06-02 MED ORDER — FENTANYL CITRATE (PF) 100 MCG/2ML IJ SOLN
INTRAMUSCULAR | Status: DC | PRN
  Administered 2021-06-02 (×2): 25 ug via INTRAVENOUS
  Administered 2021-06-02: 75 ug via INTRAVENOUS

## 2021-06-02 MED ORDER — ORAL CARE MOUTH RINSE: 15.0000 mL | Freq: Once | OROMUCOSAL | Status: AC

## 2021-06-02 MED ORDER — SODIUM CHLORIDE 0.9% FLUSH
3.0000 mL | Freq: Two times a day (BID) | INTRAVENOUS | Status: DC
  Administered 2021-06-02 – 2021-06-05 (×6): 3 mL via INTRAVENOUS

## 2021-06-02 MED ORDER — ROCURONIUM BROMIDE 10 MG/ML (PF) SYRINGE
PREFILLED_SYRINGE | INTRAVENOUS | Status: DC | PRN
Start: 2021-06-02 — End: 2021-06-02
  Administered 2021-06-02: 50 mg via INTRAVENOUS
  Administered 2021-06-02: 20 mg via INTRAVENOUS

## 2021-06-02 MED ORDER — OXYCODONE HCL 5 MG/5ML PO SOLN
5.0000 mg | Freq: Once | ORAL | Status: AC | PRN
Start: 2021-06-02 — End: 2021-06-02

## 2021-06-02 MED ORDER — ONDANSETRON HCL 4 MG/2ML IJ SOLN
4.0000 mg | Freq: Four times a day (QID) | INTRAMUSCULAR | Status: AC | PRN
  Administered 2021-06-02: 4 mg via INTRAVENOUS

## 2021-06-02 MED ORDER — ACETAMINOPHEN 325 MG PO TABS: 325.0000 mg | ORAL_TABLET | ORAL | Status: DC | PRN

## 2021-06-02 MED ORDER — MORPHINE SULFATE (PF) 2 MG/ML IV SOLN
2.0000 mg | INTRAVENOUS | Status: DC | PRN
  Administered 2021-06-02: 4 mg via INTRAVENOUS
  Administered 2021-06-02 – 2021-06-03 (×3): 2 mg via INTRAVENOUS
  Filled 2021-06-02 (×2): qty 1
  Filled 2021-06-02: qty 2
  Filled 2021-06-02: qty 1

## 2021-06-02 MED ORDER — SENNOSIDES-DOCUSATE SODIUM 8.6-50 MG PO TABS: 1.0000 | ORAL_TABLET | Freq: Every evening | ORAL | Status: DC | PRN

## 2021-06-02 MED ORDER — RIVAROXABAN 20 MG PO TABS
20.0000 mg | ORAL_TABLET | Freq: Every day | ORAL | Status: DC
  Administered 2021-06-03 – 2021-06-04 (×2): 20 mg via ORAL
  Filled 2021-06-02 (×2): qty 1

## 2021-06-02 MED ORDER — CEFAZOLIN SODIUM-DEXTROSE 1-4 GM/50ML-% IV SOLN
1.0000 g | Freq: Three times a day (TID) | INTRAVENOUS | Status: AC
  Administered 2021-06-02 – 2021-06-03 (×2): 1 g via INTRAVENOUS
  Filled 2021-06-02 (×2): qty 50

## 2021-06-02 MED ORDER — FENTANYL CITRATE (PF) 100 MCG/2ML IJ SOLN
25.0000 ug | INTRAMUSCULAR | Status: DC | PRN
  Administered 2021-06-02 (×2): 50 ug via INTRAVENOUS

## 2021-06-02 MED ORDER — PANTOPRAZOLE SODIUM 40 MG PO TBEC
40.0000 mg | DELAYED_RELEASE_TABLET | Freq: Every day | ORAL | Status: DC
  Administered 2021-06-03 – 2021-06-05 (×3): 40 mg via ORAL
  Filled 2021-06-02 (×3): qty 1

## 2021-06-02 MED ORDER — GUAIFENESIN-DM 100-10 MG/5ML PO SYRP: 15.0000 mL | ORAL_SOLUTION | ORAL | Status: DC | PRN

## 2021-06-02 MED ORDER — ONDANSETRON HCL 4 MG/2ML IJ SOLN
INTRAMUSCULAR | Status: DC | PRN
Start: 2021-06-02 — End: 2021-06-02
  Administered 2021-06-02: 4 mg via INTRAVENOUS

## 2021-06-02 MED ORDER — SODIUM CHLORIDE 0.9% FLUSH: 3.0000 mL | INTRAVENOUS | Status: DC | PRN

## 2021-06-02 MED ORDER — LACTATED RINGERS IV SOLN: INTRAVENOUS | Status: DC

## 2021-06-02 MED ORDER — HEPARIN 6000 UNIT IRRIGATION SOLUTION
Status: DC | PRN
  Administered 2021-06-02: 1

## 2021-06-02 MED ORDER — HEPARIN 6000 UNIT IRRIGATION SOLUTION
Status: AC
  Filled 2021-06-02: qty 500

## 2021-06-02 MED ORDER — CHLORHEXIDINE GLUCONATE 0.12 % MT SOLN
15.0000 mL | Freq: Once | OROMUCOSAL | Status: AC
  Administered 2021-06-02: 15 mL via OROMUCOSAL
  Filled 2021-06-02: qty 15

## 2021-06-02 MED ORDER — DOCUSATE SODIUM 100 MG PO CAPS
100.0000 mg | ORAL_CAPSULE | Freq: Two times a day (BID) | ORAL | Status: DC
  Administered 2021-06-02 – 2021-06-05 (×3): 100 mg via ORAL
  Filled 2021-06-02 (×5): qty 1

## 2021-06-02 MED ORDER — ONDANSETRON HCL 4 MG/2ML IJ SOLN
4.0000 mg | Freq: Four times a day (QID) | INTRAMUSCULAR | Status: DC | PRN
  Administered 2021-06-02 – 2021-06-04 (×5): 4 mg via INTRAVENOUS
  Filled 2021-06-02 (×5): qty 2

## 2021-06-02 MED ORDER — DEXMEDETOMIDINE (PRECEDEX) IN NS 20 MCG/5ML (4 MCG/ML) IV SYRINGE
PREFILLED_SYRINGE | INTRAVENOUS | Status: AC
  Filled 2021-06-02: qty 10

## 2021-06-02 MED ORDER — PHENYLEPHRINE 40 MCG/ML (10ML) SYRINGE FOR IV PUSH (FOR BLOOD PRESSURE SUPPORT)
PREFILLED_SYRINGE | INTRAVENOUS | Status: DC | PRN
Start: 2021-06-02 — End: 2021-06-02
  Administered 2021-06-02 (×2): 80 ug via INTRAVENOUS

## 2021-06-02 MED ORDER — CEFAZOLIN SODIUM-DEXTROSE 2-4 GM/100ML-% IV SOLN
2.0000 g | INTRAVENOUS | Status: AC
  Administered 2021-06-02: 2 g via INTRAVENOUS
  Filled 2021-06-02: qty 100

## 2021-06-02 MED ORDER — POTASSIUM CHLORIDE CRYS ER 20 MEQ PO TBCR
20.0000 meq | EXTENDED_RELEASE_TABLET | Freq: Once | ORAL | Status: AC
  Administered 2021-06-02: 20 meq via ORAL
  Filled 2021-06-02: qty 1

## 2021-06-02 MED ORDER — ONDANSETRON HCL 4 MG/2ML IJ SOLN
INTRAMUSCULAR | Status: AC
  Filled 2021-06-02: qty 2

## 2021-06-02 MED ORDER — PROPOFOL 10 MG/ML IV BOLUS
INTRAVENOUS | Status: DC | PRN
Start: 2021-06-02 — End: 2021-06-02
  Administered 2021-06-02: 150 mg via INTRAVENOUS
  Administered 2021-06-02: 20 mg via INTRAVENOUS

## 2021-06-02 MED ORDER — OXYCODONE HCL 5 MG PO TABS
ORAL_TABLET | ORAL | Status: AC
  Filled 2021-06-02: qty 1

## 2021-06-02 SURGICAL SUPPLY — 81 items
ATTRACTOMAT 16X20 MAGNETIC DRP (DRAPES) ×1 IMPLANT
BAG COUNTER SPONGE SURGICOUNT (BAG) ×2 IMPLANT
BAG SNAP BAND KOVER 36X36 (MISCELLANEOUS) ×2 IMPLANT
BALLN MUSTANG 10X80X75 (BALLOONS) ×2
BALLOON MUSTANG 10X80X75 (BALLOONS) IMPLANT
BLADE SURG 11 STRL SS (BLADE) ×2 IMPLANT
CANISTER SUCT 3000ML PPV (MISCELLANEOUS) ×2 IMPLANT
CATH BEACON 5 .035 40 KMP TP (CATHETERS) IMPLANT
CATH BEACON 5 .035 65 KMP TIP (CATHETERS) IMPLANT
CATH BEACON 5 .038 40 KMP TP (CATHETERS) ×1
CATH OMNI FLUSH 5F 65CM (CATHETERS) ×1 IMPLANT
CHLORAPREP W/TINT 26 (MISCELLANEOUS) ×2 IMPLANT
CLIP LIGATING EXTRA MED SLVR (CLIP) ×2 IMPLANT
CLIP LIGATING EXTRA SM BLUE (MISCELLANEOUS) ×2 IMPLANT
CNTNR URN SCR LID CUP LEK RST (MISCELLANEOUS) ×1 IMPLANT
CONT SPEC 4OZ STRL OR WHT (MISCELLANEOUS) ×1
COVER BACK TABLE 60X90IN (DRAPES) ×1 IMPLANT
COVER DOME SNAP 22 D (MISCELLANEOUS) ×2 IMPLANT
COVER PROBE W GEL 5X96 (DRAPES) ×2 IMPLANT
COVER SURGICAL LIGHT HANDLE (MISCELLANEOUS) ×2 IMPLANT
DERMABOND ADVANCED (GAUZE/BANDAGES/DRESSINGS) ×2
DERMABOND ADVANCED .7 DNX12 (GAUZE/BANDAGES/DRESSINGS) ×2 IMPLANT
DEVICE INFLATION ENCORE 26 (MISCELLANEOUS) ×1 IMPLANT
DRAPE FEMORAL ANGIO 80X135IN (DRAPES) ×1 IMPLANT
DRAPE INCISE IOBAN 66X45 STRL (DRAPES) ×1 IMPLANT
ELECT REM PT RETURN 9FT ADLT (ELECTROSURGICAL)
ELECTRODE REM PT RTRN 9FT ADLT (ELECTROSURGICAL) IMPLANT
GLIDEWIRE ADV .035X260CM (WIRE) ×1 IMPLANT
GLIDEWIRE NITREX 0.018X80X5 (WIRE) ×1
GLOVE SRG 8 PF TXTR STRL LF DI (GLOVE) ×1 IMPLANT
GLOVE SURG POLYISO LF SZ7.5 (GLOVE) ×3 IMPLANT
GLOVE SURG UNDER POLY LF SZ8 (GLOVE) ×2
GOWN STRL REUS W/ TWL LRG LVL3 (GOWN DISPOSABLE) ×2 IMPLANT
GOWN STRL REUS W/ TWL XL LVL3 (GOWN DISPOSABLE) ×1 IMPLANT
GOWN STRL REUS W/TWL LRG LVL3 (GOWN DISPOSABLE) ×2
GOWN STRL REUS W/TWL XL LVL3 (GOWN DISPOSABLE) ×1
GUIDEWIRE BENTSON (WIRE) ×1 IMPLANT
GUIDEWIRE NITREX 0.018X80X5 (WIRE) IMPLANT
HEMOSTASIS OSTENE BONE 2.5 (HEMOSTASIS)
HEMOSTAT HEMOSTS OSTN BONE 2.5 (HEMOSTASIS) IMPLANT
KIT BASIN OR (CUSTOM PROCEDURE TRAY) ×2 IMPLANT
KIT TURNOVER KIT B (KITS) ×2 IMPLANT
NDL PERC 18GX7CM (NEEDLE) ×1 IMPLANT
NEEDLE PERC 18GX7CM (NEEDLE) ×2 IMPLANT
NS IRRIG 1000ML POUR BTL (IV SOLUTION) ×2 IMPLANT
PACK CV ACCESS (CUSTOM PROCEDURE TRAY) ×1 IMPLANT
PACK UNIVERSAL I (CUSTOM PROCEDURE TRAY) ×1 IMPLANT
PAD ARMBOARD 7.5X6 YLW CONV (MISCELLANEOUS) ×4 IMPLANT
POWDER SURGICEL 3.0 GRAM (HEMOSTASIS) ×1 IMPLANT
PROTECTION STATION PRESSURIZED (MISCELLANEOUS)
SET MICROPUNCTURE 5F STIFF (MISCELLANEOUS) ×2 IMPLANT
SHEATH PINNACLE 5F 10CM (SHEATH) ×1 IMPLANT
SHEATH PINNACLE 8F 10CM (SHEATH) ×1 IMPLANT
SPONGE INTESTINAL PEANUT (DISPOSABLE) ×2 IMPLANT
SPONGE T-LAP 18X18 ~~LOC~~+RFID (SPONGE) ×2 IMPLANT
STATION PROTECTION PRESSURIZED (MISCELLANEOUS) ×1 IMPLANT
STOCKINETTE 3IN STRL (GAUZE/BANDAGES/DRESSINGS) IMPLANT
STOCKINETTE 6  STRL (DRAPES) ×1
STOCKINETTE 6 STRL (DRAPES) ×1 IMPLANT
STOPCOCK 3 WAY HIGH PRESSURE (MISCELLANEOUS)
STOPCOCK 3WAY HIGH PRESSURE (MISCELLANEOUS) IMPLANT
STOPCOCK MORSE 400PSI 3WAY (MISCELLANEOUS) ×2 IMPLANT
SUT MNCRL AB 4-0 PS2 18 (SUTURE) ×3 IMPLANT
SUT PROLENE 5 0 C 1 24 (SUTURE) ×1 IMPLANT
SUT VIC AB 2-0 CT1 27 (SUTURE) ×1
SUT VIC AB 2-0 CT1 TAPERPNT 27 (SUTURE) ×1 IMPLANT
SUT VIC AB 3-0 SH 27 (SUTURE) ×1
SUT VIC AB 3-0 SH 27X BRD (SUTURE) IMPLANT
SYR 10ML LL (SYRINGE) ×8 IMPLANT
SYR 20ML LL LF (SYRINGE) ×4 IMPLANT
SYR 30ML LL (SYRINGE) ×2 IMPLANT
SYR MEDRAD MARK V 150ML (SYRINGE) ×2 IMPLANT
TOWEL GREEN STERILE (TOWEL DISPOSABLE) ×4 IMPLANT
TRAY FOLEY MTR SLVR 16FR STAT (SET/KITS/TRAYS/PACK) IMPLANT
TUBING CIL FLEX 10 FLL-RA (TUBING) ×1 IMPLANT
TUBING HIGH PRESSURE 120CM (CONNECTOR) IMPLANT
UNDERPAD 30X36 HEAVY ABSORB (UNDERPADS AND DIAPERS) ×2 IMPLANT
WATER STERILE IRR 1000ML POUR (IV SOLUTION) ×2 IMPLANT
WIRE BENTSON .035X145CM (WIRE) ×2 IMPLANT
WIRE HI TORQ VERSACORE J 260CM (WIRE) IMPLANT
WIRE TORQFLEX AUST .018X40CM (WIRE) ×3 IMPLANT

## 2021-06-02 NOTE — H&P (Signed)
[facility] [place]  Patient name: Candice Cruz MRN: [id number] DOB: 02/21/1996 Sex: female    HISOTRY OF PRESENT ILLNESS:    Candice Cruz is a 26 y.o. female who initially presented to the hospital with right upper extremity swelling on 05/09/2021.  This was her first episode.  She complains of supraclavicular tenderness.  She also had some discoloration of her fingers.  She was found to have a right upper extremity DVT.  She then underwent mechanical thrombectomy of her axillary subclavian and brachiocephalic veins with subsequent balloon venoplasty.  This was felt to be secondary to thoracic outlet.  She was sent home on a coagulation and is back today for first rib.  She has not had any prior similar episodes.  There is no family history of DVT.   PAST MEDICAL HISTORY:   Past Medical History:  Diagnosis Date   Anxiety    Depression    DVT (deep venous thrombosis) ([place]) 05/08/2021   right axillary and subclavian DVTs 05/08/21, s/p thrombectomy, venoplasty 05/09/21     FAMILY HISTORY:   History reviewed. No pertinent family history.  SOCIAL HISTORY:   Social History   Tobacco Use   Smoking status: Never   Smokeless tobacco: Never  Substance Use Topics   Alcohol use: No     ALLERGIES:   Allergies  Allergen Reactions   Amitriptyline     Other reaction(s): dizziness   Duloxetine Other (See Comments)    Panic attacks     CURRENT MEDICATIONS:   Current Facility-Administered Medications  Medication Dose Route Frequency Provider Last Rate Last Admin   0.9 %  sodium chloride infusion   Intravenous Continuous Jaclene Bartelt, Butch Penny, MD       ceFAZolin (ANCEF) IVPB 2g/100 mL premix  2 g Intravenous 30 min Pre-Op Serafina Mitchell, MD       Chlorhexidine Gluconate Cloth 2 % PADS 6 each  6 each Topical Once Serafina Mitchell, MD       And   Chlorhexidine Gluconate Cloth 2 % PADS 6 each  6 each Topical Once  Serafina Mitchell, MD       lactated ringers infusion   Intravenous Continuous Serafina Mitchell, MD 10 mL/hr at 06/02/21 0842 New Bag at 06/02/21 0842    REVIEW OF SYSTEMS:   [X]  denotes positive finding, [ ]  denotes negative finding Cardiac  Comments:  Chest pain or chest pressure:    Shortness of breath upon exertion:    Short of breath when lying flat:    Irregular heart rhythm:        Vascular    Pain in calf, thigh, or hip brought on by ambulation:    Pain in feet at night that wakes you up from your sleep:     Blood clot in your veins:    Leg swelling:         Pulmonary    Oxygen at home:    Productive cough:     Wheezing:         Neurologic    Sudden weakness in arms or legs:     Sudden numbness in arms or legs:     Sudden onset of difficulty speaking or slurred speech:    Temporary loss of vision in one eye:     Problems with dizziness:         Gastrointestinal    Blood in stool:     Vomited blood:  Genitourinary    Burning when urinating:     Blood in urine:        Psychiatric    Major depression:         Hematologic    Bleeding problems:    Problems with blood clotting too easily:        Skin    Rashes or ulcers:        Constitutional    Fever or chills:      PHYSICAL EXAM:   Vitals:   06/02/21 0751  BP: 120/65  Pulse: 86  Resp: 18  Temp: 98.2 F (36.8 C)  TempSrc: Oral  SpO2: 100%  Weight: 49.9 kg  Height: 5\' 5"  (1.651 m)    GENERAL: The patient is a well-nourished female, in no acute distress. The vital signs are documented above. CARDIAC: There is a regular rate and rhythm.  VASCULAR: Radial pulse, slight discoloration.  No edema PULMONARY: Non-labored respirations ABDOMEN: Soft and non-tender with normal pitched bowel sounds.  MUSCULOSKELETAL: There are no major deformities or cyanosis. NEUROLOGIC: No focal weakness or paresthesias are detected. SKIN: There are no ulcers or rashes noted. PSYCHIATRIC: The patient has a  normal affect.  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  We discussed the risk of brachial plexus injury, wound issues, and bleeding.  All questions were answered.    Leia Alf, MD, FACS [facility] [phone] Pager [phone]

## 2021-06-02 NOTE — Transfer of Care (Signed)
Immediate Anesthesia Transfer of Care Note  Patient: Candice Cruz  Procedure(s) Performed: RIGHT FIRST RIB RESECTION (Right) ULTRASOUND GUIDANCE FOR VASCULAR ACCESS (Right) VENOGRAM RIGHT UPPER EXTREMITY (Right)  Patient Location: PACU  Anesthesia Type:General  Level of Consciousness: drowsy and patient cooperative  Airway & Oxygen Therapy: Patient Spontanous Breathing  Post-op Assessment: Report given to RN and Post -op Vital signs reviewed and stable  Post vital signs: Reviewed and stable  Last Vitals:  Vitals Value Taken Time  BP 146/101 06/02/21 1255  Temp 36.4 C 06/02/21 1250  Pulse 75 06/02/21 1250  Resp 21 06/02/21 1305  SpO2    Vitals shown include unvalidated device data.  Last Pain:  Vitals:   06/02/21 0836  TempSrc:   PainSc: 0-No pain         Complications: No notable events documented.

## 2021-06-02 NOTE — Progress Notes (Signed)
Pt arrived to unit from PACU VSS, A/O x 4,  CCMD called ,CHG given, pt oriented to unit. Incision on right arm skin  Everlean Cherry, RN    06/02/21 1656  Vitals  Temp 97.9 F (36.6 C)  Temp Source Oral  BP 107/69  MAP (mmHg) 81  BP Location Right Leg  BP Method Automatic  Patient Position (if appropriate) Lying  Pulse Rate 64  Pulse Rate Source Monitor  ECG Heart Rate 63  Resp 20  Level of Consciousness  Level of Consciousness Alert  Oxygen Therapy  SpO2 100 %  O2 Device Room Air  O2 Flow Rate (L/min) 0 L/min  Pain Assessment  Pain Scale 0-10  Pain Score 3  MEWS Score  MEWS Temp 0  MEWS Systolic 0  MEWS Pulse 0  MEWS RR 0  MEWS LOC 0  MEWS Score 0  MEWS Score Color [place] Si

## 2021-06-02 NOTE — Anesthesia Procedure Notes (Signed)
Procedure Name: Intubation Date/Time: 06/02/2021 10:23 AM Performed by: Georgia Duff, CRNA Pre-anesthesia Checklist: Patient identified, Emergency Drugs available, Suction available and Patient being monitored Patient Re-evaluated:Patient Re-evaluated prior to induction Oxygen Delivery Method: Circle System Utilized Preoxygenation: Pre-oxygenation with 100% oxygen Induction Type: IV induction Ventilation: Mask ventilation without difficulty Laryngoscope Size: Miller and 2 Grade View: Grade II Tube type: Oral Tube size: 7.0 mm Number of attempts: 1 Airway Equipment and Method: Stylet and Oral airway Placement Confirmation: ETT inserted through vocal cords under direct vision, positive ETCO2 and breath sounds checked- equal and bilateral Secured at: 21 cm Tube secured with: Tape Dental Injury: Teeth and Oropharynx as per pre-operative assessment

## 2021-06-02 NOTE — Op Note (Signed)
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 Randie Heinz, MD Assistants: Durene Cal, MD; Nathanial Rancher, [place]; Dayna Barker, MS 3 Procedure Performed: Right transaxillary first rib resection  Indications: 26 year old female with right upper extremity venous thoracic outlet syndrome.  She has undergone balloon angioplasty of her right subclavian and axillary veins.  She is now indicated for resection of the first rib.  Assistants were necessary to facilitate exposure and help with removal of the first rib.  Findings: There was normal thoracic outlet anatomy.  There was significant scar tissue around the axillary vein and venous lysis was performed.   Procedure:  The patient was identified in the holding area and taken to the operating room where general anesthesia was induced.  She was placed in the left lateral decubitus position her arm was prepped and as well as her right axilla.  She was sterilely draped and antibiotics were administered.  Timeout was called.  Incision was made at the inferior border of the hairline between the pectoralis major and latissimus dorsi.  We dissected down to the chest wall using cautery.  We then bluntly dissected the intercostal brachial nerve was identified and protected.  We dissected up to the first rib bluntly where we identified the anterior and middle scalene muscles and the artery was easily visualized.  We bluntly remove the intercostal muscles from the inferior aspect of the rib as well as overlying the rib.  Middle scalene muscle was bluntly removed from the cephalad aspect of the rib.  The anterior scalene was divided using scissors approximately 1 cm off of the rib.  We then bluntly remove the pleural attachments to the rib.  Right ankle was placed around the rib it was transected and the  anterior most portion.  Was then transected posteriorly just anterior to the brachial plexus the brachial plexus was identified and protected when it was being transected.  We then remove the rest of the bone with rongeur and smoothed both aspects with rasp.  We then performed venous lysis using sharp dissection around the axillary vein up to the level where it was safe.  We then irrigated the wound Valsalva was performed there was no drainage no bubbling to suggest pneumothorax.  We then obtained hemostasis in the wound.  We closed the superficial layer with 2-0 Vicryl and the skin was closed with 4-0 Monocryl.  Dermabond is placed at the skin level.  Attention was then turned to upper extremity venography performed by Dr. Myra Gianotti and this will be dictated separately.  She was then awakened from anesthesia having tolerated procedure well without immediate complication.  All counts were correct at completion.  EBL: 50 cc   Masson Nalepa C. Randie Heinz, MD [facility] [place] Office: [phone] Pager: [phone]

## 2021-06-03 DIAGNOSIS — G540 Brachial plexus disorders: Secondary | ICD-10-CM

## 2021-06-03 MED ORDER — TRAMADOL HCL 50 MG PO TABS
100.0000 mg | ORAL_TABLET | Freq: Four times a day (QID) | ORAL | Status: DC | PRN
Start: 2021-06-03 — End: 2021-06-04
  Administered 2021-06-03 (×3): 100 mg via ORAL
  Filled 2021-06-03 (×3): qty 2

## 2021-06-03 NOTE — Consult Note (Signed)
°  Brief Consult note Received consult from Graceann Congress, [place] for "Patient having adjustment issues. Had to cancel trip to [place] due to having surgery. Having a lot of anxiety".   Patient seen briefly this AM for consult as above. Upon entering room, she is  ordering a meal from food service. Mother present in room.  Once her order was completed, I introduced myself to patient and purpose of consult. Patient stated " I was having difficulties adjusting to pain" and admits to feeling anxious.  Patient with bright affect. Memory Argue to speak with patient regarding anxiety and mood; however, patient declines formal consult at this time.   Please re-consult psychiatry service if patient changes her mind. If she is open to speaking with someone regarding her anxiety, can consider chaplain consult if she is amenable.  if she is interested in seeking outpatient services, can consult TOC/SW for outpatient resources. Discussed recommendations with PA Baglia via epic secure chat. Psychiatry to sign off at this time.   Estella Husk, MD Attending psychiatrist

## 2021-06-03 NOTE — Op Note (Signed)
° ° °  Patient name: Candice Cruz MRN: [id number] DOB: 25-Nov-1995 Sex: female  06/02/2021 Pre-operative Diagnosis: Right thoracic outlet syndrome Post-operative diagnosis:  Same Surgeon:  Durene Cal Co-surgeon:  Lemar Livings Assistant: C. Baglia Procedure:   #1: Right first rib resection with subclavian vein vena lysis   #2: Ultrasound-guided access, right axillary vein   #3: Central venogram Anesthesia: General Blood Loss: 100 cc Specimens: None  Findings: Right first rib resection completed without difficulty.  Vena lysis was performed.  There was significant scar tissue around the axillary and subclavian vein.  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  Unsuccessful attempt at recanalization.  Indications: This is a 26 year old female who initially presented with right upper extremity thoracic outlet syndrome.  She was found to have an occluded subclavian and axillary vein which were able to be thrombectomized and recanalized and subsequent treated with balloon venoplasty.  She has been maintained on anticoagulation and comes in today for first rib resection.  Procedure:  The patient was identified in the holding area and taken to [facility] OR ROOM 16  The patient was then placed supine on the table. general anesthesia was administered.  The patient was prepped and draped in the usual sterile fashion.  A time out was called and antibiotics were administered.  2 surgeons were required for the procedure as well as an assistant to provide better exposure and help with exposure and treatment.  Please see Dr Darcella Cheshire note for the rib resection.  Once this was complete, I attempted to cannulate the brachial vein as well as the basilic vein however these were very small and difficult to visualize with ultrasound.  Therefore I evaluated her venous system near the axilla.  There were numerous collaterals.  I was ultimately able to cannulate the axillary vein with a micropuncture needle under  ultrasound guidance.  A 018 wire was inserted followed by placement of micropuncture sheath.  I then performed venography which showed that the axillary and subclavian veins had reoccluded.  I upsized to an 8 [place] sheath.  Using a Glidewire advantage and a Berenstein catheter, I tried to recanalize the veins however I ended up in a subintimal plane and could not reenter.  We elected to abort the procedure at this time as it was felt that even if we were to be successful and recanalizing the vein, this would likely require stenting and this is something that we would try to avoid.  Therefore we will plan on returning at a later date for a repeat attempt at recanalization of the vein either from the arm or possibly from the groin.  The sheath and wires were removed and manual pressure was held for hemostasis.  Disposition: To PACU stable   V. Durene Cal, M.D., [facility] [place] Office: [phone] Pager:  [phone]

## 2021-06-03 NOTE — Progress Notes (Addendum)
°  Progress Note    06/03/2021 8:57 AM 1 Day Post-Op  Subjective:  RUE sore, Nauseous from pain medication   Vitals:   06/03/21 0340 06/03/21 0833  BP: (!) 96/58 104/74  Pulse: (!) 49   Resp: 20 16  Temp: 98.4 F (36.9 C) 98.5 F (36.9 C)  SpO2: 99% 100%   Physical Exam: Cardiac:  regular Lungs:  non labored Incisions:  right axillary incision is c/d/I without swelling or hematoma Extremities:  well perfused and warm. 2+ right radial pulse Neurologic: alert and oriented  CBC    Component Value Date/Time   WBC 16.4 (H) 06/02/2021 1710   RBC 4.16 06/02/2021 1710   HGB 12.4 06/02/2021 1710   HCT 36.7 06/02/2021 1710   PLT 263 06/02/2021 1710   MCV 88.2 06/02/2021 1710   MCH 29.8 06/02/2021 1710   MCHC 33.8 06/02/2021 1710   RDW 11.8 06/02/2021 1710   LYMPHSABS 1.4 05/08/2021 1353   MONOABS 0.5 05/08/2021 1353   EOSABS 0.0 05/08/2021 1353   BASOSABS 0.0 05/08/2021 1353    BMET    Component Value Date/Time   NA 134 (L) 06/02/2021 1710   K 3.6 06/02/2021 1710   CL 105 06/02/2021 1710   CO2 20 (L) 06/02/2021 1710   GLUCOSE 181 (H) 06/02/2021 1710   BUN 6 06/02/2021 1710   CREATININE 0.72 06/02/2021 1710   CALCIUM 8.5 (L) 06/02/2021 1710   GFRNONAA >60 06/02/2021 1710   GFRAA >60 05/19/2016 0122    INR    Component Value Date/Time   INR 1.1 06/02/2021 0842     Intake/Output Summary (Last 24 hours) at 06/03/2021 0857 Last data filed at 06/02/2021 1950 Gross per 24 hour  Intake 1220 ml  Output 50 ml  Net 1170 ml     Assessment/Plan:  26 y.o. female is s/p Right transaxillary first rib resection 1 Day Post-Op   Pain control PRN. D/c oxycodone start Tramadol Incision is c/d/I without swelling or hematoma RUE well perfused and warm with palpable 2+ radial pulse Mild RUE swelling Elevate RUE as tolerated Restart Xarelto today PT to eval   DVT prophylaxis:  Xarelto   Graceann Congress, PA-C [facility] [phone] 06/03/2021 8:57  AM  I agree with the above.  I have seen and examined the patient.  POD#1 Work with PT Palpable right radial pulse, minimal right arm edema Discussed returning for repeat attempt at opening her central vein occlusion.  We will discuss further in the office at follow up Xarelto to restart today Oxycodone switched to Ultram given nausea  Wells Kingsly Kloepfer

## 2021-06-03 NOTE — Evaluation (Signed)
Physical Therapy Evaluation Patient Details Name: Candice Cruz MRN: [id number] DOB: 1996/02/21 Today's Date: 06/03/2021  History of Present Illness  Pt is a 26 y.o. female who presented 06/02/21 for R transaxillary first rib resection secondary to thoracic outlet syndrome. Of note, this began with her presenting to hospital with R UE swelling, supraclavicular tenderness, and R finger discoloration 05/09/21. At that time, she was found to have a R UE DVT. She then underwent mechanical thrombectomy of her axillary subclavian and brachiocephalic veins with subsequent balloon venoplasty 1/10. PMH: anxiety and depression   Clinical Impression  Pt presents with condition above and deficits mentioned below, see PT Problem List. PTA, she was independent, living with her parents in a 2-level house. Currently, pt displays deficits in sensation at R shoulder, R shoulder AROM, R shoulder strength, and balance. She also has cold, slightly purple fingers on her R hand, notified RN. I suspect her balance and functional mobility will quickly progress as she continues to mobilize while here. Encouraged pt to limit pushing/pulling and carrying objects with R UE initially to likely < 5 lbs to allow area to heal. Also, encouraged pt to avoid internal rotation of R shoulder and limit shoulder flexion and abduction to <90 degrees to allow healing and decompression at this time. Encouraged pt to move through tolerable ranges with all exercises and to avoid panful positions. Provided HEP to address cervical and R scapula and shoulder AROM at this time. Pt would benefit from follow-up with OPPT to address her deficits in R UE strength, stability, and ROM and provide work conditioning as needed to prepare pt for her new job that starts in a month. Will continue to follow acutely.      Recommendations for follow up therapy are one component of a multi-disciplinary discharge planning process, led by the attending physician.   Recommendations may be updated based on patient status, additional functional criteria and insurance authorization.  Follow Up Recommendations Outpatient PT    Assistance Recommended at Discharge PRN  Patient can return home with the following  A little help with bathing/dressing/bathroom;Assistance with cooking/housework    Equipment Recommendations None recommended by PT  Recommendations for Other Services  OT consult    Functional Status Assessment Patient has had a recent decline in their functional status and demonstrates the ability to make significant improvements in function in a reasonable and predictable amount of time.     Precautions / Restrictions Precautions Precautions: Other (comment) Precaution Comments: R UE thoracic outlet syndrome Restrictions Weight Bearing Restrictions: No Other Position/Activity Restrictions: Dr. Myra Gianotti confirmed no weight bearing restrictions as of 2/4; this PT decided to limit pushing/pulling/carrying to < 5 lbs and limit shoulder flexion & abduction to < 90' and hold off on shoulder internal rotation      Mobility  Bed Mobility Overal bed mobility: Modified Independent             General bed mobility comments: Pt able to exit L side of bed (to limit pushing up on R arm) without assistance, extra time and HOB elevated.    Transfers Overall transfer level: Needs assistance Equipment used: None Transfers: Sit to/from Stand Sit to Stand: Supervision           General transfer comment: Supervision for safety, extra time and slight trunk sway being up on feet first time since surgery per pt.    Ambulation/Gait Ambulation/Gait assistance: Supervision Gait Distance (Feet): 425 Feet Assistive device: None Gait Pattern/deviations: Step-through pattern, Decreased stride length, Narrow  base of support Gait velocity: reduced Gait velocity interpretation: 1.31 - 2.62 ft/sec, indicative of limited community ambulator   General  Gait Details: Pt with slowed and focused gait with narrow BOS, reporting feeling a little unsteady due to bouts of emesis yesterday and feeling unwell this morning and not being up on feet yet since surgery, per pt. No LOB, supervision for safety. Cues to relax R UE down by side vs holding in protective elbow flexed position.  Stairs            Wheelchair Mobility    Modified Rankin (Stroke Patients Only)       Balance Overall balance assessment: Mild deficits observed, not formally tested                                           Pertinent Vitals/Pain Pain Assessment Pain Assessment: Faces Faces Pain Scale: Hurts little more Pain Location: R axillary region and shoulder Pain Descriptors / Indicators: Discomfort, Grimacing, Operative site guarding Pain Intervention(s): Limited activity within patient's tolerance, Monitored during session, Repositioned    Home Living Family/patient expects to be discharged to:: Private residence Living Arrangements: Parent Available Help at Discharge: Family;Available 24 hours/day Type of Home: House Home Access: Level entry     Alternate Level Stairs-Number of Steps: flight Home Layout: Two level;Bed/bath upstairs;Able to live on main level with bedroom/bathroom Home Equipment: None      Prior Function Prior Level of Function : Independent/Modified Independent               ADLs Comments: 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     Hand Dominance   Dominant Hand: Left    Extremity/Trunk Assessment   Upper Extremity Assessment Upper Extremity Assessment: RUE deficits/detail (L UE intact sensation and strength 5/5) RUE Deficits / Details: Incision at axillary region; Hand cold to palpation and slightly purple in color, notified RN; Did not formally assess MMT scores to limit pushing/pulling and pressure for surgical site to heal; denies  numbness/tingling with light touch throughout except slightly numb at shoulder; limited AROM shoulder flexion and abduction to ~90 degrees; good mobility in scapula noted RUE Sensation: decreased light touch (at shoulder)    Lower Extremity Assessment Lower Extremity Assessment: Overall WFL for tasks assessed (sensation intact; 5/5 bil MMT)    Cervical / Trunk Assessment Cervical / Trunk Assessment: Other exceptions Cervical / Trunk Exceptions: Limited AROM cervical rotation to R  Communication   Communication: No difficulties  Cognition Arousal/Alertness: Awake/alert Behavior During Therapy: WFL for tasks assessed/performed Overall Cognitive Status: Within Functional Limits for tasks assessed                                          General Comments General comments (skin integrity, edema, etc.): Based on protocols found by this PT: Encouraged pt to limit pushing/pulling and carrying objects with R UE initially to likely < 5 lbs to allow area to heal. Also, encouraged pt to avoid internal rotation of R shoulder, like to reach posteriorly behind back, at this time, and limit shoulder flexion and abduction to <90 degrees to allow healing and decompression at this time. Encouraged pt to move through tolerable ranges with all  exercises and to avoid panful positions. Educated pt on elevating R UE and to notify us if changes in sensation occur. Medbridge HEP Access Code: [email or web address]    Exercises Other Exercises Other Exercises: AROM cervical flexion, extension, and bil rotation to pt tolerance Other Exercises: AROM R shoudler flexion and abduction to <90 degrees to pt tolerance Other Exercises: AROM shoulder shurgs, protraction, restraction, depression and circles to pt tolerance   Assessment/Plan    PT Assessment Patient needs continued PT services  PT Problem List Decreased strength;Decreased range of motion;Decreased balance;Decreased mobility;Decreased knowledge of  precautions;Impaired sensation;Pain       PT Treatment Interventions Gait training;Stair training;Functional mobility training;Therapeutic activities;Therapeutic exercise;Balance training;Neuromuscular re-education;Patient/family education    PT Goals (Current goals can be found in the Care Plan section)  Acute Rehab PT Goals Patient Stated Goal: to be able to do her new job in a month PT Goal Formulation: With patient/family Time For Goal Achievement: 06/17/21 Potential to Achieve Goals: Good    Frequency Min 3X/week     Co-evaluation               AM-PAC PT "6 Clicks" Mobility  Outcome Measure Help needed turning from your back to your side while in a flat bed without using bedrails?: None Help needed moving from lying on your back to sitting on the side of a flat bed without using bedrails?: None Help needed moving to and from a bed to a chair (including a wheelchair)?: A Little Help needed standing up from a chair using your arms (e.g., wheelchair or bedside chair)?: A Little Help needed to walk in hospital room?: A Little Help needed climbing 3-5 steps with a railing? : A Little 6 Click Score: 20    End of Session   Activity Tolerance: Patient tolerated treatment well Patient left: in chair;with call bell/phone within reach;with family/visitor present Nurse Communication: Mobility status;Other (comment) (hand purple) PT Visit Diagnosis: Unsteadiness on feet (R26.81);Other abnormalities of gait and mobility (R26.89);Muscle weakness (generalized) (M62.81);Other symptoms and signs involving the nervous system (R29.898);Pain Pain - Right/Left: Right Pain - part of body: Shoulder    Time: 1333-1400 PT Time Calculation (min) (ACUTE ONLY): 27 min   Charges:   PT Evaluation $PT Eval Moderate Complexity: 1 Mod PT Treatments $Therapeutic Activity: 8-22 mins        Raymond GurneyAnessa Pettis, PT, DPT Acute Rehabilitation Services  Pager: [phone] Office:  [phone]   Jewel Baizenessa M Pettis 06/03/2021, 2:47 PM

## 2021-06-04 MED ORDER — MECLIZINE HCL 12.5 MG PO TABS
12.5000 mg | ORAL_TABLET | Freq: Two times a day (BID) | ORAL | Status: DC | PRN
  Administered 2021-06-04: 12.5 mg via ORAL
  Filled 2021-06-04 (×2): qty 1

## 2021-06-04 NOTE — Progress Notes (Signed)
Patient with periods of dizziness and nausea today, intermittently. Movement seems to make it worse. Dr. Myra Gianotti made aware and orders received.Reeves Dam, Randall An  RN

## 2021-06-04 NOTE — Progress Notes (Signed)
Patient arm elevated in sling as ordered. Amour Trigg, Bettina Gavia rN

## 2021-06-04 NOTE — Evaluation (Signed)
Occupational Therapy Evaluation Patient Details Name: Candice Cruz MRN: [id number] DOB: June 30, 1995 Today's Date: 06/04/2021   History of Present Illness Pt is a 26 y.o. female who presented 06/02/21 for R transaxillary first rib resection secondary to thoracic outlet syndrome. Of note, this began with her presenting to hospital with R UE swelling, supraclavicular tenderness, and R finger discoloration 05/09/21. At that time, she was found to have a R UE DVT. She then underwent mechanical thrombectomy of her axillary subclavian and brachiocephalic veins with subsequent balloon venoplasty 1/10. PMH: anxiety and depression   Clinical Impression   26 yo female previously indep with ADLs and mobility admitted for above detailed medical management. She reports feeling dizzy and nauseated which is limiting her mobility and eating this date. She participated well in evaluation demonstrating ROM WFL to distal portion of RUE. Of note, her hand is mottled and discolored on arrival with pt reporting increased edema and decreased sensation compared to previous sessions. RN aware. Pt reports need to have UE elevated and demonstrated technique for elevation with pillow to minimize edema pooling in elbow. Pt educated in hemi-dressing techniques and will plan to follow-up for demonstration with clothing at next session. Assisted RN with ordering universal arm elevating sling to position RUE per surgical order to manage edema. Will continue to follow acutely for education in modified techniques with ADLs.   Defer to surgical protocol for therapy post dc. Did educate pt and father in ergonomic work station to be set-up prior to starting new job once Careers adviser clears her to start.      Recommendations for follow up therapy are one component of a multi-disciplinary discharge planning process, led by the attending physician.  Recommendations may be updated based on patient status, additional functional criteria and insurance  authorization.   Follow Up Recommendations  Follow physician's recommendations for discharge plan and follow up therapies    Assistance Recommended at Discharge Set up Supervision/Assistance  Patient can return home with the following Assistance with cooking/housework;A little help with bathing/dressing/bathroom    Functional Status Assessment  Patient has had a recent decline in their functional status and demonstrates the ability to make significant improvements in function in a reasonable and predictable amount of time.  Equipment Recommendations  None recommended by OT       Precautions / Restrictions Precautions Precautions: Other (comment) (limit shoulder flexion and external rotation) Precaution Comments: R UE thoracic outlet syndrome Required Braces or Orthoses: Sling (for comfort and elevation) Restrictions Weight Bearing Restrictions: No      Mobility Bed Mobility Overal bed mobility: Modified Independent             General bed mobility comments: Pt able to exit L side of bed (to limit pushing up on R arm) without assistance, extra time and HOB elevated.    Transfers Overall transfer level: Needs assistance Equipment used: None Transfers: Sit to/from Stand Sit to Stand: Supervision                  Balance Overall balance assessment: Mild deficits observed, not formally tested                 ADL either performed or assessed with clinical judgement   ADL Overall ADL's : Needs assistance/impaired Eating/Feeding: Modified independent   Grooming: Independent;Wash/dry hands   Upper Body Bathing: Moderate assistance;Minimal assistance   Lower Body Bathing: Set up   Upper Body Dressing : Minimal assistance;Moderate assistance   Lower Body Dressing: Minimal assistance  Toilet Transfer: Modified Stage manager and Hygiene: Set up;Modified independent         General ADL Comments:  (shoulder ROM  restrictions)     Vision Baseline Vision/History: 1 Wears glasses              Pertinent Vitals/Pain Pain Assessment Pain Assessment: 0-10 Pain Score: 1  Pain Location: R axillary region and shoulder Pain Descriptors / Indicators: Discomfort, Grimacing, Operative site guarding     Hand Dominance Left   Extremity/Trunk Assessment Upper Extremity Assessment Upper Extremity Assessment: RUE deficits/detail RUE Deficits / Details: Incision at axillary region; Hand cold to palpation and mottled skin apeparance, did not formally assess MMT scores to limit pushing/pulling and pressure for surgical site to heal;  limited AROM shoulder flexion and abduction to ~90 degrees, no precautions documented outside of order for elevating elbow above shoulder RUE: Shoulder pain with ROM   Lower Extremity Assessment Lower Extremity Assessment: Overall WFL for tasks assessed       Communication Communication Communication: No difficulties   Cognition Arousal/Alertness: Awake/alert Behavior During Therapy: WFL for tasks assessed/performed Overall Cognitive Status: Within Functional Limits for tasks assessed     General Comments  surgical incisions    Exercises Exercises: Other exercises (gentle AROM to lower portion of arm and hand)        Home Living Family/patient expects to be discharged to:: Private residence Living Arrangements: Parent Available Help at Discharge: Family;Available 24 hours/day Type of Home: House Home Access: Level entry     Home Layout: Two level;Bed/bath upstairs;Able to live on main level with bedroom/bathroom Alternate Level Stairs-Number of Steps: flight Alternate Level Stairs-Rails: Left Bathroom Shower/Tub: Producer, television/film/video: Standard     Home Equipment: None          Prior Functioning/Environment Prior Level of Function : Independent/Modified Independent               ADLs Comments: 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        OT Problem List: Decreased strength;Decreased range of motion;Impaired sensation      OT Treatment/Interventions: Self-care/ADL training;Therapeutic exercise    OT Goals(Current goals can be found in the care plan section) Acute Rehab OT Goals Patient Stated Goal: Increase indep with ADLs, minimize dizziness with mobility OT Goal Formulation: With patient Time For Goal Achievement: 06/18/21 Potential to Achieve Goals: Good ADL Goals Pt Will Perform Upper Body Bathing: with min assist;with adaptive equipment Pt Will Perform Upper Body Dressing: with set-up;sitting Pt Will Perform Lower Body Dressing: with modified independence Pt Will Perform Toileting - Clothing Manipulation and hygiene: with modified independence  OT Frequency: Min 4X/week       AM-PAC OT "6 Clicks" Daily Activity     Outcome Measure Help from another person eating meals?: None Help from another person taking care of personal grooming?: None Help from another person toileting, which includes using toliet, bedpan, or urinal?: A Little Help from another person bathing (including washing, rinsing, drying)?: A Lot Help from another person to put on and taking off regular upper body clothing?: A Lot Help from another person to put on and taking off regular lower body clothing?: A Little 6 Click Score: 18   End of Session Nurse Communication: Mobility status  Activity Tolerance: Patient tolerated treatment well Patient left: in bed;with family/visitor present  OT Visit Diagnosis: Other abnormalities  of gait and mobility (R26.89);Pain                Time: 1520-1550 OT Time Calculation (min): 30 min Charges:  OT General Charges $OT Visit: 1 Visit OT Evaluation $OT Eval Low Complexity: 1 Low OT Treatments $Self Care/Home Management : 8-22 mins   Jasmine Pang Ruth Kovich, OTR/L 06/04/2021, 4:30 PM

## 2021-06-04 NOTE — Progress Notes (Addendum)
°  Progress Note    06/04/2021 9:28 AM 2 Days Post-Op  Subjective:  tearful this morning due to dizziness and concerns about swelling and numbness in right arm   Vitals:   06/04/21 0346 06/04/21 0808  BP: 103/63 (!) 111/57  Pulse: 60 72  Resp: 20 16  Temp: 97.6 F (36.4 C) 98 F (36.7 C)  SpO2: 100% 99%   Physical Exam: Cardiac:  regular Lungs:  non labored Incisions:  axillary incision c/d/I without swelling or hematoma Extremities:  RUE well perfused and warm. Palpable radial pulse. Right arm edematous Neurologic: alert and oriented  CBC    Component Value Date/Time   WBC 16.4 (H) 06/02/2021 1710   RBC 4.16 06/02/2021 1710   HGB 12.4 06/02/2021 1710   HCT 36.7 06/02/2021 1710   PLT 263 06/02/2021 1710   MCV 88.2 06/02/2021 1710   MCH 29.8 06/02/2021 1710   MCHC 33.8 06/02/2021 1710   RDW 11.8 06/02/2021 1710   LYMPHSABS 1.4 05/08/2021 1353   MONOABS 0.5 05/08/2021 1353   EOSABS 0.0 05/08/2021 1353   BASOSABS 0.0 05/08/2021 1353    BMET    Component Value Date/Time   NA 134 (L) 06/02/2021 1710   K 3.6 06/02/2021 1710   CL 105 06/02/2021 1710   CO2 20 (L) 06/02/2021 1710   GLUCOSE 181 (H) 06/02/2021 1710   BUN 6 06/02/2021 1710   CREATININE 0.72 06/02/2021 1710   CALCIUM 8.5 (L) 06/02/2021 1710   GFRNONAA >60 06/02/2021 1710   GFRAA >60 05/19/2016 0122    INR    Component Value Date/Time   INR 1.1 06/02/2021 0842     Intake/Output Summary (Last 24 hours) at 06/04/2021 9450 Last data filed at 06/04/2021 0802 Gross per 24 hour  Intake 3 ml  Output 0 ml  Net 3 ml     Assessment/Plan:  26 y.o. female is s/p  Right transaxillary first rib resection  2 Days Post-Op   Pain control PRN. Tolerating only IV due to dizziness and nausea Incision is c/d/I without swelling or hematoma RUE well perfused and warm with palpable 2+ radial pulse RUE swelling Elevate RUE as tolerated. Will try to get arm in sling Order placed for [facility] PT Will be stable for d/c  once pain better controlled  DVT prophylaxis:  Xarelto   Graceann Congress, PA-C [facility] [phone] 06/04/2021 9:28 AM   I agree with the above.  I have seen and evaluated the patient.  She is postoperative day #2 from a right transaxillary rib resection.  She worked with physical therapy yesterday and is complaining of swelling in her right arm.  I inspected her axillary incision and this is without hematoma.  She has a palpable radial pulse.  I encouraged her to elevate her arm.  We also discussed follow-up venography once she has recovered.  Durene Cal

## 2021-06-04 NOTE — Progress Notes (Signed)
Patient ambulated in hallway with family. Brenton Joines Jessup RN  

## 2021-06-05 ENCOUNTER — Encounter (HOSPITAL_COMMUNITY): Payer: Self-pay | Admitting: Surgery

## 2021-06-05 ENCOUNTER — Other Ambulatory Visit: Payer: Self-pay | Admitting: Physician Assistant

## 2021-06-05 DIAGNOSIS — I871 Compression of vein: Secondary | ICD-10-CM

## 2021-06-05 DIAGNOSIS — G540 Brachial plexus disorders: Secondary | ICD-10-CM

## 2021-06-05 MED ORDER — ONDANSETRON HCL 4 MG/2ML IJ SOLN: 4.0000 mg | Freq: Four times a day (QID) | INTRAMUSCULAR | 0 refills | Status: DC | PRN

## 2021-06-05 MED ORDER — TRAMADOL HCL 50 MG PO TABS: 100.0000 mg | ORAL_TABLET | Freq: Four times a day (QID) | ORAL | 0 refills | Status: AC | PRN

## 2021-06-05 MED ORDER — CHLORHEXIDINE GLUCONATE CLOTH 2 % EX PADS
6.0000 | MEDICATED_PAD | Freq: Every day | CUTANEOUS | Status: DC
  Administered 2021-06-05: 6 via TOPICAL

## 2021-06-05 MED ORDER — MUPIROCIN 2 % EX OINT
1.0000 "application " | TOPICAL_OINTMENT | Freq: Two times a day (BID) | CUTANEOUS | Status: DC
  Administered 2021-06-05: 1 via NASAL
  Filled 2021-06-05: qty 22

## 2021-06-05 MED ORDER — ONDANSETRON HCL 4 MG PO TABS: 4.0000 mg | ORAL_TABLET | Freq: Four times a day (QID) | ORAL | 0 refills | Status: DC | PRN

## 2021-06-05 NOTE — Progress Notes (Signed)
Occupational Therapy Treatment Patient Details Name: Candice Cruz MRN: [id number] DOB: [birth year] Today's Date: 06/05/2021   History of present illness 26 y.o. female who presented 06/02/21 for R transaxillary first rib resection secondary to thoracic outlet syndrome. Of note, this began with her presenting to hospital with R UE swelling, supraclavicular tenderness, and R finger discoloration 05/09/21. At that time, she was found to have a R UE DVT. She then underwent mechanical thrombectomy of her axillary subclavian and brachiocephalic veins with subsequent balloon venoplasty 1/10. PMH: anxiety and depression   OT comments  Pt progressing towards established OT goals. Providing education on compensatory techniques for UB bathing and dressing; pt verbalized understanding. MD confirming no ROM or WB restrictions for RUE. Providing pt with HEP including exercises for ROM, strength, and endurance. Pt verbalized and demonstrated understanding. Pt completing functional mobility in hallway. Answered all questions in preparation for dc later today. Recommend dc to home and follow up with OP OT if continued weakness and functional deficits.    Recommendations for follow up therapy are one component of a multi-disciplinary discharge planning process, led by the attending physician.  Recommendations may be updated based on patient status, additional functional criteria and insurance authorization.    Follow Up Recommendations  No OT follow up (Recommending contact doctor for OP OT if strength, ROM, and endurance are not returning to baseline)    Assistance Recommended at Discharge Set up Supervision/Assistance  Patient can return home with the following  Assistance with cooking/housework;A little help with bathing/dressing/bathroom   Equipment Recommendations  None recommended by OT    Recommendations for Other Services      Precautions / Restrictions Precautions Precautions: None Precaution  Comments: no restrictions per MD Required Braces or Orthoses: Sling (for elevation/comfort) Restrictions Other Position/Activity Restrictions: Dr. Myra Gianotti confirmed no WB or ROM restrictions as of 2/4 and 2/6       Mobility Bed Mobility Overal bed mobility: Modified Independent             General bed mobility comments: No assist needed.    Transfers Overall transfer level: Modified independent                       Balance Overall balance assessment: Modified Independent                                         ADL either performed or assessed with clinical judgement   ADL Overall ADL's : Modified independent                   Upper Body Dressing Details (indicate cue type and reason): Providing education on compensatory techniques for UB dressing. Discuss both for button up shirts, over head shirts, and bras   Lower Body Dressing Details (indicate cue type and reason): Able to use figure four position for adjusting socks             Functional mobility during ADLs: Modified independent General ADL Comments: Providing education on compensatory tehcniques to reduce pain and exercises.    Extremity/Trunk Assessment Upper Extremity Assessment Upper Extremity Assessment: RUE deficits/detail RUE Deficits / Details: Incision at axillary region; Slight edema (proximal > distal). Limited shoulder flexion to 90* due to stiffness and pain. Limited abduction to 60* RUE: Shoulder pain with ROM (minor) RUE Sensation: decreased light touch (at shoulder) RUE Coordination: decreased gross motor  Lower Extremity Assessment Lower Extremity Assessment: Overall WFL for tasks assessed        Vision       Perception     Praxis      Cognition Arousal/Alertness: Awake/alert Behavior During Therapy: WFL for tasks assessed/performed Overall Cognitive Status: Within Functional Limits for tasks assessed                                  General Comments: Very motivated        Exercises Exercises: Other exercises Other Exercises Other Exercises: shoulder rolls; x5 both forward and then back. Other Exercises: scapular retraction; x5 Other Exercises: wall slides; standing; x10; forward flexion Other Exercises: wall slides; standing; x10; adbuction Other Exercises: providing information on wall circles for increasing endurance    Shoulder Instructions       General Comments Mother present. Provided handout with exercises    Pertinent Vitals/ Pain       Pain Assessment Pain Assessment: Faces Faces Pain Scale: Hurts a little bit Pain Location: right shoulder/axillary region Pain Descriptors / Indicators: Sore, Heaviness Pain Intervention(s): Monitored during session  Home Living                                          Prior Functioning/Environment              Frequency  Min 4X/week        Progress Toward Goals  OT Goals(current goals can now be found in the care plan section)  Progress towards OT goals: Progressing toward goals  Acute Rehab OT Goals OT Goal Formulation: With patient Time For Goal Achievement: 06/18/21 Potential to Achieve Goals: Good ADL Goals Pt Will Perform Upper Body Bathing: with min assist;with adaptive equipment Pt Will Perform Upper Body Dressing: with set-up;sitting Pt Will Perform Lower Body Dressing: with modified independence Pt Will Perform Toileting - Clothing Manipulation and hygiene: with modified independence  Plan Discharge plan remains appropriate    Co-evaluation                 AM-PAC OT "6 Clicks" Daily Activity     Outcome Measure   Help from another person eating meals?: None Help from another person taking care of personal grooming?: None Help from another person toileting, which includes using toliet, bedpan, or urinal?: A Little Help from another person bathing (including washing, rinsing, drying)?: A Lot Help  from another person to put on and taking off regular upper body clothing?: A Lot Help from another person to put on and taking off regular lower body clothing?: A Little 6 Click Score: 18    End of Session    OT Visit Diagnosis: Other abnormalities of gait and mobility (R26.89);Pain   Activity Tolerance Patient tolerated treatment well   Patient Left in bed;with family/visitor present   Nurse Communication Mobility status        Time: 1610-9604 OT Time Calculation (min): 25 min  Charges: OT General Charges $OT Visit: 1 Visit OT Treatments $Self Care/Home Management : 8-22 mins $Therapeutic Exercise: 8-22 mins  Binyamin Nelis MSOT, OTR/L Acute Rehab Pager: [phone] Office: [phone]  Theodoro Grist Reka Wist 06/05/2021, 1:53 PM

## 2021-06-05 NOTE — Progress Notes (Signed)
Patient given discharge instructions, medication list and follow up appointments. Patient prescriptions sent to personal pharmacy. IV and tele were dcd will discharge home as ordered. Transported to exit via wheel chair and nursing staff.Staphanie Harbison, Randall An RN

## 2021-06-05 NOTE — Progress Notes (Signed)
Physical Therapy Treatment Patient Details Name: Candice Cruz MRN: [id number] DOB: [birth year] Today's Date: 06/05/2021   History of Present Illness Pt is a 26 y.o. female who presented 06/02/21 for R transaxillary first rib resection secondary to thoracic outlet syndrome. Of note, this began with her presenting to hospital with R UE swelling, supraclavicular tenderness, and R finger discoloration 05/09/21. At that time, she was found to have a R UE DVT. She then underwent mechanical thrombectomy of her axillary subclavian and brachiocephalic veins with subsequent balloon venoplasty 1/10. PMH: anxiety and depression    PT Comments    Patient reports feeling much better today; slept well last night and no more N/V. Reports pain is improved as well as sensation in RUE as well. Color and swelling looked great. Session focused on functional mobility, stair training and AROM/stretching/exercises of RUE. Per MD, no mobility restrictions of RUE. Able to move RUE past 90 degrees with gentle stretching and instructed pt in postural stretches/AROM of cervical spine. Discussed some dressing modifications for her new job. Tolerated walking with RUE in dependent position today and improved gait speed. Pt does not require skilled therapy in the acute setting as she has met most of her goals. Will be appropriate for OPPT once cleared by surgeon. All education completed. Discharge from therapy.    Recommendations for follow up therapy are one component of a multi-disciplinary discharge planning process, led by the attending physician.  Recommendations may be updated based on patient status, additional functional criteria and insurance authorization.  Follow Up Recommendations  Outpatient PT     Assistance Recommended at Discharge PRN  Patient can return home with the following A little help with bathing/dressing/bathroom;Assistance with cooking/housework   Equipment Recommendations  None recommended by PT     Recommendations for Other Services       Precautions / Restrictions Precautions Precautions: None Precaution Comments: no restrictions per MD Required Braces or Orthoses: Sling (for elevation/comfort) Restrictions Weight Bearing Restrictions: No Other Position/Activity Restrictions: Dr. Trula Slade confirmed no weight bearing restrictions as of 2/4     Mobility  Bed Mobility Overal bed mobility: Modified Independent             General bed mobility comments: No assist needed.    Transfers Overall transfer level: Needs assistance Equipment used: None Transfers: Sit to/from Stand Sit to Stand: Modified independent (Device/Increase time)           General transfer comment: Stood from EOB without difficulty, no dizziness. Able to dangle RUE    Ambulation/Gait Ambulation/Gait assistance: Modified independent (Device/Increase time) Gait Distance (Feet): 300 Feet Assistive device: None Gait Pattern/deviations: Step-through pattern, Decreased stride length   Gait velocity interpretation: 1.31 - 2.62 ft/sec, indicative of limited community ambulator   General Gait Details: Slow guarded gait but able to increase speed with cues; decreased arm swing with RUE but able to dangle in dependent position throughout activity. No worsening discoloration or pain today.   Stairs Stairs: Yes Stairs assistance: Modified independent (Device/Increase time) Stair Management: One rail Left Number of Stairs: 13 General stair comments: Cues for safety.   Wheelchair Mobility    Modified Rankin (Stroke Patients Only)       Balance Overall balance assessment: Modified Independent                                          Cognition Arousal/Alertness: Awake/alert Behavior During  Therapy: WFL for tasks assessed/performed Overall Cognitive Status: Within Functional Limits for tasks assessed                                          Exercises  Other Exercises Other Exercises: AAROM/stretching onto shoulder flexion, abduction to tolerance with 30 sec holds x3 Other Exercises: Cervical AROM rotation both directions x10 Other Exercises: Shoulder shrugs, scapular retraction    General Comments General comments (skin integrity, edema, etc.): Mother present.      Pertinent Vitals/Pain Pain Assessment Pain Assessment: Faces Faces Pain Scale: Hurts a little bit Pain Location: right shoulder/axillary region Pain Descriptors / Indicators: Sore, Heaviness Pain Intervention(s): Monitored during session, Repositioned    Home Living                          Prior Function            PT Goals (current goals can now be found in the care plan section) Progress towards PT goals: Goals met/education completed, patient discharged from PT    Frequency    Min 3X/week      PT Plan Current plan remains appropriate    Co-evaluation              AM-PAC PT "6 Clicks" Mobility   Outcome Measure  Help needed turning from your back to your side while in a flat bed without using bedrails?: None Help needed moving from lying on your back to sitting on the side of a flat bed without using bedrails?: None Help needed moving to and from a bed to a chair (including a wheelchair)?: None Help needed standing up from a chair using your arms (e.g., wheelchair or bedside chair)?: None Help needed to walk in hospital room?: A Little Help needed climbing 3-5 steps with a railing? : A Little 6 Click Score: 22    End of Session   Activity Tolerance: Patient tolerated treatment well Patient left: in bed;with call bell/phone within reach;with family/visitor present Nurse Communication: Mobility status PT Visit Diagnosis: Unsteadiness on feet (R26.81);Other abnormalities of gait and mobility (R26.89);Muscle weakness (generalized) (M62.81);Other symptoms and signs involving the nervous system (R29.898);Pain Pain - Right/Left:  Right Pain - part of body: Shoulder     Time: 5500-1642 PT Time Calculation (min) (ACUTE ONLY): 20 min  Charges:  $Therapeutic Exercise: 8-22 mins                     Marisa Severin, PT, DPT Acute Rehabilitation Services Pager [phone] Office [place] 06/05/2021, 9:53 AM

## 2021-06-05 NOTE — Progress Notes (Signed)
° ° °  Subjective  - POD #3  Doing better this morning.  Dizziness has improved.   Physical Exam:  Palpable right radial pulse Decreased right arm swelling Good grip strength to the right arm       Assessment/Plan:  POD #3  Meclizine helped with her dizziness.  She feels much better today  Decreased right arm swelling after keeping it elevated.  She is still going to need repeat venography in a few weeks.  She continues to work with physical therapy.  When she feels comfortable, she can be discharged home  Candice Cruz 06/05/2021 8:26 AM --  Vitals:   06/05/21 0200 06/05/21 0739  BP: (!) 105/54 104/61  Pulse: (!) 59   Resp: 18 18  Temp: 100.1 F (37.8 C) 98.6 F (37 C)  SpO2:  98%   No intake or output data in the 24 hours ending 06/05/21 0826   Laboratory CBC    Component Value Date/Time   WBC 16.4 (H) 06/02/2021 1710   HGB 12.4 06/02/2021 1710   HCT 36.7 06/02/2021 1710   PLT 263 06/02/2021 1710    BMET    Component Value Date/Time   NA 134 (L) 06/02/2021 1710   K 3.6 06/02/2021 1710   CL 105 06/02/2021 1710   CO2 20 (L) 06/02/2021 1710   GLUCOSE 181 (H) 06/02/2021 1710   BUN 6 06/02/2021 1710   CREATININE 0.72 06/02/2021 1710   CALCIUM 8.5 (L) 06/02/2021 1710   GFRNONAA >60 06/02/2021 1710   GFRAA >60 05/19/2016 0122    COAG Lab Results  Component Value Date   INR 1.1 06/02/2021   INR 2.0 (H) 05/30/2021   No results found for: PTT  Antibiotics Anti-infectives (From admission, onward)    Start     Dose/Rate Route Frequency Ordered Stop   06/02/21 1830  ceFAZolin (ANCEF) IVPB 1 g/50 mL premix        1 g 100 mL/hr over 30 Minutes Intravenous Every 8 hours 06/02/21 1631 06/03/21 0246   06/02/21 0746  ceFAZolin (ANCEF) IVPB 2g/100 mL premix        2 g 200 mL/hr over 30 Minutes Intravenous 30 min pre-op 06/02/21 0746 06/02/21 1029        V. Charlena Cross, M.D., [facility] [place] Office:  [phone] Pager:  [phone]

## 2021-06-05 NOTE — Discharge Instructions (Signed)
° °  [facility]  Discharge Instructions  Please refer to the following instructions for your post-procedure care. Your surgeon or physician assistant will discuss any changes with you.  Activity  You may drive the day following your surgery, if you are comfortable and no longer taking prescription pain medication. Resume full activity as the soreness in your incision resolves.  Bathing/Showering  You may shower after you go home. Keep your incision dry for 48 hours. Do not soak in a bathtub, hot tub, or swim until the incision heals completely. You may not shower if you have a hemodialysis catheter.  Incision Care  Clean your incision with mild soap and water after 48 hours. Pat the area dry with a clean towel. You do not need a bandage unless otherwise instructed. Do not apply any ointments or creams to your incision. You may have skin glue on your incision. Do not peel it off. It will come off on its own in about one week. Your arm may swell a bit after surgery. To reduce swelling use pillows to elevate your arm so it is above your heart. Your doctor will tell you if you need to lightly wrap your arm with an ACE bandage.  Diet  Resume your normal diet. There are not special food restrictions following this procedure. In order to heal from your surgery, it is CRITICAL to get adequate nutrition. Your body requires vitamins, minerals, and protein. Vegetables are the best source of vitamins and minerals. Vegetables also provide the perfect balance of protein. Processed food has little nutritional value, so try to avoid this.  Medications  Resume taking all of your medications. If your incision is causing pain, you may take over-the counter pain relievers such as acetaminophen (Tylenol). If you were prescribed a stronger pain medication, please be aware these medications can cause nausea and constipation. Prevent nausea by taking the medication with a snack or  meal. Avoid constipation by drinking plenty of fluids and eating foods with high amount of fiber, such as fruits, vegetables, and grains.   Do not take Tylenol if you are taking prescription pain medications.  Follow up Your surgeon may want to see you in the office following your surgery. If so, this will be arranged at the time of your surgery.  Please call us immediately for any of the following conditions:  Increased pain, redness, drainage (pus) from your incision site Fever of 101 degrees or higher Severe or worsening pain at your incision site Hand pain or numbness.  Reduce your risk of vascular disease:  Stop smoking. If you would like help, call [facility] at 1-800-QUIT-NOW (7072267272) or [place] at [phone]  Manage your cholesterol Maintain a desired weight Control your diabetes Keep your blood pressure down   06/05/2021 Tiombe Tomeo [id number] 1995-05-28  Surgeon(s): Nada Libman, MD Maeola Harman, MD  Procedure(s): RIGHT FIRST RIB RESECTION ULTRASOUND GUIDANCE FOR VASCULAR ACCESS VENOGRAM RIGHT UPPER EXTREMITY  If you have any questions, please call the office at [phone].

## 2021-06-05 NOTE — Anesthesia Postprocedure Evaluation (Signed)
Anesthesia Post Note  Patient: Candice Cruz  Procedure(s) Performed: RIGHT FIRST RIB RESECTION (Right) ULTRASOUND GUIDANCE FOR VASCULAR ACCESS (Right) VENOGRAM RIGHT UPPER EXTREMITY (Right)     Patient location during evaluation: PACU Anesthesia Type: General Level of consciousness: awake and alert Pain management: pain level controlled Vital Signs Assessment: post-procedure vital signs reviewed and stable Respiratory status: spontaneous breathing, nonlabored ventilation, respiratory function stable and patient connected to nasal cannula oxygen Cardiovascular status: blood pressure returned to baseline and stable Postop Assessment: no apparent nausea or vomiting Anesthetic complications: no   No notable events documented.  Last Vitals:  Vitals:   06/05/21 0739 06/05/21 1116  BP: 104/61 99/60  Pulse:    Resp: 18   Temp: 37 C 36.8 C  SpO2: 98% 100%    Last Pain:  Vitals:   06/05/21 1116  TempSrc: Oral  PainSc:                  [place]

## 2021-06-05 NOTE — TOC Transition Note (Signed)
Transition of Care (TOC) - CM/SW Discharge Note Donn Pierini RN, BSN Transitions of Care Unit 4E- RN Case Manager See Treatment Team for direct phone #    Patient Details  Name: Candice Cruz MRN: [id number] Date of Birth: 05/29/1995  Transition of Care [facility]) CM/SW Contact:  Darrold Span, RN Phone Number: 06/05/2021, 2:05 PM   Clinical Narrative:    Pt stable for transition home today w/ family. Noted recommendations per PT for outpt therapy follow up. Also noted HHPT orders placed. Msg sent to C. Baglia PA-C with Vascular to clarify discharge therapy needs- verbal order given for outpt PT referral.  Pt lives in [place]- will refer to Outpt on [address]. For ongoing PT needs.   Referral sent via epic to [facility] Outpt on [facility].    Final next level of care: OP Rehab Barriers to Discharge: No Barriers Identified   Patient Goals and CMS Choice    Outtpt PT    Discharge Placement               Home        Discharge Plan and Services                DME Arranged: N/A DME Agency: NA       HH Arranged: NA HH Agency: NA        Social Determinants of Health (SDOH) Interventions     Readmission Risk Interventions Readmission Risk Prevention Plan 06/05/2021  Post Dischage Appt Complete  Medication Screening Complete  Transportation Screening Complete  Some recent data might be hidden

## 2021-06-07 ENCOUNTER — Ambulatory Visit: Payer: Commercial Managed Care - HMO | Attending: Surgery

## 2021-06-07 ENCOUNTER — Other Ambulatory Visit: Payer: Self-pay

## 2021-06-07 DIAGNOSIS — M6281 Muscle weakness (generalized): Secondary | ICD-10-CM | POA: Insufficient documentation

## 2021-06-07 DIAGNOSIS — R293 Abnormal posture: Secondary | ICD-10-CM | POA: Insufficient documentation

## 2021-06-07 DIAGNOSIS — M25511 Pain in right shoulder: Secondary | ICD-10-CM | POA: Insufficient documentation

## 2021-06-07 NOTE — Therapy (Signed)
OUTPATIENT PHYSICAL THERAPY SHOULDER EVALUATION   Patient Name: Candice Cruz MRN: [id number] DOB:09/06/1995, 26 y.o., female Today's Date: 06/07/2021   PT End of Session - 06/07/21 1446     Visit Number 1    Number of Visits 17    Date for PT Re-Evaluation 08/05/21    Authorization Type [employer]    PT Start Time 1447    PT Stop Time 1534    PT Time Calculation (min) 47 min    Activity Tolerance Patient tolerated treatment well    Behavior During Therapy [facility] for tasks assessed/performed             Past Medical History:  Diagnosis Date   Anxiety    Depression    DVT (deep venous thrombosis) (HCC) 05/08/2021   right axillary and subclavian DVTs 05/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, s/p thrombectomy, venoplasty 05/09/21   Past Surgical History:  Procedure Laterality Date   APPENDECTOMY     CARDIAC CATHETERIZATION     INTRAVASCULAR ULTRASOUND/IVUS Right 05/09/2021   Procedure: Intravascular Ultrasound/IVUS;  Surgeon: Nada Libman, MD;  Location: [facility] INVASIVE CV LAB;  Service: Cardiovascular;  Laterality: Right;  upper extremity   LAPAROSCOPIC APPENDECTOMY N/A 05/19/2016   Procedure: APPENDECTOMY LAPAROSCOPIC;  Surgeon: Harriette Bouillon, MD;  Location: [facility] OR;  Service: General;  Laterality: N/A;   PERIPHERAL VASCULAR BALLOON ANGIOPLASTY Right 05/09/2021   Procedure: PERIPHERAL VASCULAR BALLOON ANGIOPLASTY;  Surgeon: Nada Libman, MD;  Location: [facility] INVASIVE CV LAB;  Service: Cardiovascular;  Laterality: Right;  subclavian vein   PERIPHERAL VASCULAR THROMBECTOMY Right 05/09/2021   Procedure: PERIPHERAL VASCULAR THROMBECTOMY;  Surgeon: Nada Libman, MD;  Location: [facility] INVASIVE CV LAB;  Service: Cardiovascular;  Laterality: Right;  subclavian vein   RIB RESECTION Right 06/02/2021   Procedure: RIGHT FIRST RIB RESECTION;  Surgeon: Nada Libman, MD;  Location: [facility] OR;  Service: Vascular;  Laterality: Right;   ULTRASOUND GUIDANCE FOR VASCULAR ACCESS Right 06/02/2021   Procedure: ULTRASOUND GUIDANCE FOR  VASCULAR ACCESS;  Surgeon: Nada Libman, MD;  Location: [facility] OR;  Service: Vascular;  Laterality: Right;   UPPER EXTREMITY VENOGRAPHY N/A 05/09/2021   Procedure: UPPER EXTREMITY VENOGRAPHY;  Surgeon: Nada Libman, MD;  Location: [facility] INVASIVE CV LAB;  Service: Cardiovascular;  Laterality: N/A;   VENOGRAM Right 06/02/2021   Procedure: VENOGRAM RIGHT UPPER EXTREMITY;  Surgeon: Nada Libman, MD;  Location: [facility] OR;  Service: Vascular;  Laterality: Right;   Patient Active Problem List   Diagnosis Date Noted   Venous thrombosis of upper extremity 06/02/2021   DVT (deep venous thrombosis) (HCC) 05/09/2021   Acute appendicitis 05/19/2016    PCP: Farris Has, MD  REFERRING PROVIDER: Nada Libman, MD  REFERRING DIAG: R transaxillary first rib resection secondary to thoracic outlet syndrome.  THERAPY DIAG:  Acute pain of right shoulder  Muscle weakness (generalized)  Abnormal posture   ONSET DATE: 06/02/21  SUBJECTIVE:  SUBJECTIVE STATEMENT: Patient reports she is doing a lot better today s/p Rt first rib resection secondary to thoracic outlet syndrome. "The first 3 days were rough." She was told by her PT/OT in the hospital that she did not have ROM restrictions, but is unsure if her lifting restriction is 5 or 10 lbs. She has f/u with Dr. Myra Gianotti in 2 weeks. She reports another blood cot did form, so they have plans to do another procedure for this, but nothing has been scheduled. No pain currently, but she is unable to lay flat due to pain in her shoulder blade (must lay on wedge). She reports numbness along the axilla that is constant. She reports the swelling is improving and she wears a sling periodically to allow the arm to elevate.   PERTINENT HISTORY: RUE DVT 05/08/21  PAIN:  Are you having pain?  No NPRS scale: 0/10 Worst pain: 7/10  Pain location: Rt scapula PAIN TYPE: pinching  Pain description: intermittent  Aggravating factors: unknown Relieving factors: laying on wedge pillow   PRECAUTIONS: Other: awaiting confirmation from referring provider regarding ROM/lifting restrictions  WEIGHT BEARING RESTRICTIONS  awaiting confirmation from referring provider regarding WB restrictions  FALLS:  Has patient fallen in last 6 months? 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 The remainder of the upper extremity vasculature from the axillary artery to the palmar arch on the ulnar side appears patent. 2. Mild edema within the subcutaneous soft tissues of the upper extremity without focal fluid collection.    PATIENT SURVEYS:  FOTO 52% to 73%  COGNITION:  Overall cognitive status: Within functional limits for tasks assessed     SENSATION:  Light touch: decreased sensation about posterior axilla   POSTURE: Forward head/rounded shoulders  PALPATION: Tautness and palpable tenderness Rt upper trap, levator scapulae, rhomboids  Cervical AROM: WNL all planes; mild ache with cervical  extension and Rt side bend along right posterior neck  OBSERVATION:  incision site healing well with no signs of infection; no obvious swelling or discoloration about the RUE.   UPPER EXTREMITY AROM/PROM: Left shoulder AROM WNL   A/PROM Right 06/07/2021 Left 06/07/2021  Shoulder flexion 83   Shoulder extension    Shoulder abduction 110   Shoulder adduction    Shoulder internal rotation 90   Shoulder external rotation 40   Elbow flexion    Elbow extension    Wrist flexion    Wrist extension    Wrist ulnar deviation    Wrist radial deviation    Wrist pronation    Wrist supination    (Blank rows = not tested)  UPPER EXTREMITY MMT: MMT deferred secondary to post-op acuity   MMT Right 06/07/2021 Left 06/07/2021  Shoulder flexion  Shoulder extension    Shoulder abduction    Shoulder adduction    Shoulder internal rotation    Shoulder external rotation    Middle trapezius    Lower trapezius    Elbow flexion    Elbow extension    Wrist flexion    Wrist extension    Wrist ulnar deviation    Wrist radial deviation    Wrist pronation    Wrist supination    Grip strength (lbs) 45 45  (Blank rows = not tested)     TODAY'S TREATMENT:  [facility] Adult PT Treatment:                                                DATE: 06/07/21 Therapeutic Exercise: Demonstrated and issued HEP.   Therapeutic Activity: Education on assessment findings that will be addressed throughout duration of POC.   Self Care: Education on signs and symptoms of pulmonary embolism that would warrant immediate medical attention.     PATIENT EDUCATION: Education details: see treatment above  Person educated: Patient Education method: Explanation, Demonstration, Tactile cues, Verbal cues, and Handouts Education comprehension: verbalized understanding, returned demonstration, verbal cues required, tactile cues required, and needs further education   HOME EXERCISE PROGRAM: Access Code: [email or web address] URL:  [email or web address] Date: 06/07/2021 Prepared by: Letitia Libra  Exercises Seated Scapular Retraction - 2 x daily - 7 x weekly - 2 sets - 10 reps Seated Cervical Sidebending Stretch - 2 x daily - 7 x weekly - 3 sets - 30 sec hold Seated Levator Scapulae Stretch - 2 x daily - 7 x weekly - 3 sets - 30 sec hold Shoulder Flexion Wall Slide with Towel - 2 x daily - 7 x weekly - 1 sets - 10 reps Standing Shoulder Abduction Slides at Wall - 2 x daily - 7 x weekly - 1 sets - 10 reps Standing Shoulder External Rotation AAROM with Dowel - 2 x daily - 7 x weekly - 1 sets - 10 reps   ASSESSMENT:  CLINICAL IMPRESSION: Patient is a 26 y.o. Lt-handed female who was seen today for physical therapy evaluation and treatment for s/p Rt first rib resection secondary to thoracic outlet syndrome on 06/02/21. Upon assessment she is noted to have normal cervical AROM, though significant Rt shoulder ROM and strength deficits that are consistent with her recent post-operative status. She will benefit from skilled PT to assist in restoring her RUE ROM and strength in order to optimize her function.   REHAB POTENTIAL: Excellent  CLINICAL DECISION MAKING: Evolving/moderate complexity  EVALUATION COMPLEXITY: Moderate   GOALS: Goals reviewed with patient? No  SHORT TERM GOALS:  STG Name Target Date Goal status  1 Patient will be independent and compliant with initial HEP.   Baseline:  06/28/2021 INITIAL  2 Patient will tolerate lying flat in supine to improve her overall sleep quality.  Baseline: must utilize wedge due to pain. 07/05/2021 INITIAL  3 Patient will demonstrate at least 120 degrees of Rt shoulder flexion AROM to improve ability to reach into cabinet.  Baseline: 07/05/2021 INITIAL   LONG TERM GOALS:   LTG Name Target Date Goal status  1 Patient will demonstrate at least 160 degrees of Rt shoulder flexion and abduction AROM to improve ability to reach overhead.  Baseline: 08/02/2021  INITIAL  2 Patient will demonstrate at least 60 degrees of  Rt shoulder ER AROM to improve ability to complete self-care activities.  Baseline: 08/02/2021 INITIAL  3 Patient will demonstrate at least 4+/5 Rt shoulder and elbow strength to improve ability to lift and carry objects once cleared by MD for lifting.  Baseline: 08/02/2021 INITIAL  4 Patient will score at least 73% on FOTO to signify clinically meaningful improvement in function.  Baseline: 08/02/2021 INITIAL   PLAN: PT FREQUENCY: 2x/week  PT DURATION: 8 weeks  PLANNED INTERVENTIONS: Therapeutic exercises, Therapeutic activity, Neuro Muscular re-education, Patient/Family education, Joint mobilization, Dry Needling, Cryotherapy, Moist heat, Taping, and Manual therapy  PLAN FOR NEXT SESSION: review HEP, confirm ROM/lifting restrictions, gentle ROM/strengthening   Letitia LibraSamantha Burgess Sheriff, PT, DPT, ATC 06/07/21 5:47 PM

## 2021-06-09 ENCOUNTER — Telehealth: Payer: Self-pay

## 2021-06-09 NOTE — Telephone Encounter (Signed)
Pt c/o itchy rash around incision. Incision looks good w/o redness or drianage. Pt has been advised to try anti itch cream around, not on, incision and take Benadryl, as needed. She will call us back if this does not improve or anything changes.

## 2021-06-12 ENCOUNTER — Telehealth: Payer: Self-pay

## 2021-06-12 ENCOUNTER — Other Ambulatory Visit: Payer: Self-pay

## 2021-06-12 MED ORDER — METHYLPREDNISOLONE 4 MG PO TBPK: ORAL_TABLET | ORAL | 0 refills | Status: DC

## 2021-06-12 NOTE — Telephone Encounter (Signed)
Patient called concerned that part of her glue has come off of the edge of her incision for a rib resection.  She also still has a significant rash "despite using cortisol cream and benadryl".  Per Dr. Myra Gianotti, it is normal for the glue to start coming undone.  She will need a prescription for a Solu-Medrol dose pack for her rash.  Patient is aware and verbalized understanding.  She will pick up her Rx at her local pharmacy.

## 2021-06-14 NOTE — Therapy (Signed)
OUTPATIENT PHYSICAL THERAPY TREATMENT NOTE   Patient Name: Candice Cruz MRN: [id number] DOB:07-15-1995, 26 y.o., female Today's Date: 06/15/2021  PCP: Farris Has, MD REFERRING PROVIDER: Nada Libman, MD   PT End of Session - 06/15/21 1147     Visit Number 2    Number of Visits 17    Date for PT Re-Evaluation 08/05/21    Authorization Type [employer]    PT Start Time 1147    PT Stop Time 1230    PT Time Calculation (min) 43 min    Activity Tolerance Patient tolerated treatment well    Behavior During Therapy [facility] for tasks assessed/performed             Past Medical History:  Diagnosis Date   Anxiety    Depression    DVT (deep venous thrombosis) (HCC) 05/08/2021   right axillary and subclavian DVTs 05/08/21, s/p thrombectomy, venoplasty 05/09/21   Past Surgical History:  Procedure Laterality Date   APPENDECTOMY     CARDIAC CATHETERIZATION     INTRAVASCULAR ULTRASOUND/IVUS Right 05/09/2021   Procedure: Intravascular Ultrasound/IVUS;  Surgeon: Nada Libman, MD;  Location: [facility] IN[facility] CV [facility];  Service: Cardiovascular;  Laterality: Right;  upper extremity   LAPAROSCOPIC APPENDECTOMY N/A 05/19/2016   Procedure: APPENDECTOMY LAPAROSCOPIC;  Surgeon: Harriette Bouillon, MD;  Location: [facility] OR;  Service: General;  Laterality: N/A;   PERIPHERAL VASCULAR BALLOON ANGIOPLASTY Right 05/09/2021   Procedure: PERIPHERAL VASCULAR BALLOON ANGIOPLASTY;  Surgeon: Nada Libman, MD;  Location: [facility];  Service: Cardiovascular;  Laterality: Right;  subclavian vein   PERIPHERAL VASCULAR THROMBECTOMY Right 05/09/2021   Procedure: PERIPHERAL VASCULAR THROMBECTOMY;  Surgeon: Nada Libman, MD;  Location: [facility];  Service: Cardiovascular;  Laterality: Right;  subclavian vein   RIB RESECTION Right 06/02/2021   Procedure: RIGHT FIRST RIB RESECTION;  Surgeon: Nada Libman, MD;  Location: [facility] OR;  Service: Vascular;  Laterality: Right;   ULTRASOUND GUIDANCE FOR VASCULAR  ACCESS Right 06/02/2021   Procedure: ULTRASOUND GUIDANCE FOR VASCULAR ACCESS;  Surgeon: Nada Libman, MD;  Location: [facility] OR;  Service: Vascular;  Laterality: Right;   UPPER EXTREMITY VENOGRAPHY N/A 05/09/2021   Procedure: UPPER EXTREMITY VENOGRAPHY;  Surgeon: Nada Libman, MD;  Location: [facility];  Service: Cardiovascular;  Laterality: N/A;   VENOGRAM Right 06/02/2021   Procedure: VENOGRAM RIGHT UPPER EXTREMITY;  Surgeon: Nada Libman, MD;  Location: [facility] OR;  Service: Vascular;  Laterality: Right;   Patient Active Problem List   Diagnosis Date Noted   Venous thrombosis of upper extremity 06/02/2021   DVT (deep venous thrombosis) (HCC) 05/09/2021   Acute appendicitis 05/19/2016    REFERRING DIAG: R transaxillary first rib resection secondary to thoracic outlet syndrome.  THERAPY DIAG:  Acute pain of right shoulder  Muscle weakness (generalized)  Abnormal posture  PERTINENT HISTORY: RUE DVT 05/08/21  PRECAUTIONS: per referring provider no ROM and weightbearing restriction   SUBJECTIVE: Patient reports her shoulder continues to feel better and her motion is improving. She is concerned about the glue coming off the surgical site. She has f/u with Dr. Myra Gianotti on Monday. She reports being able to lay flat now.  PAIN:  Are you having pain? No NPRS scale: 0/10 Pain location: N/A PAIN TYPE: N/A Pain description: N/A Aggravating factors: N/A Relieving factors: N/A OBJECTIVE:    DIAGNOSTIC FINDINGS:  CT angiography RUE: IMPRESSION: 1. Apparent high origin of the radial artery from the axillary artery with somewhat diminutive  appearing radial artery, this vessel is patent to the level of the mid forearm after which there is no significant flow enhancement which is probably related to contrast timing/inadequate opacification but patency of the distal vessel cannot be established on this exam. The remainder of the upper extremity vasculature from the axillary artery to  the palmar arch on the ulnar side appears patent. 2. Mild edema within the subcutaneous soft tissues of the upper extremity without focal fluid collection.     PATIENT SURVEYS:  FOTO 52% to 73%                                 SENSATION:          Light touch: decreased sensation about posterior axilla           POSTURE: Forward head/rounded shoulders   PALPATION: Tautness and palpable tenderness Rt upper trap, levator scapulae, rhomboids   Cervical AROM: WNL all planes; mild ache with cervical extension and Rt side bend along right posterior neck   OBSERVATION:  incision site healing well with no signs of infection; no obvious swelling or discoloration about the RUE.   06/15/21: red, raised rash about axilla and upper arm; no discharge noted, incision site healing well with glue in place   UPPER EXTREMITY AROM/PROM: Left shoulder AROM WNL    A/PROM Right 06/07/2021 Left 06/07/2021 06/15/21 Right  Shoulder flexion 83   160  Shoulder extension       Shoulder abduction 110     Shoulder adduction       Shoulder internal rotation 90     Shoulder external rotation 40     Elbow flexion       Elbow extension       Wrist flexion       Wrist extension       Wrist ulnar deviation       Wrist radial deviation       Wrist pronation       Wrist supination       (Blank rows = not tested)   UPPER EXTREMITY MMT: MMT deferred secondary to post-op acuity    MMT Right 06/07/2021 Left 06/07/2021  Shoulder flexion      Shoulder extension      Shoulder abduction      Shoulder adduction      Shoulder internal rotation      Shoulder external rotation      Middle trapezius      Lower trapezius      Elbow flexion      Elbow extension      Wrist flexion      Wrist extension      Wrist ulnar deviation      Wrist radial deviation      Wrist pronation      Wrist supination      Grip strength (lbs) 45 45  (Blank rows = not tested)                TODAY'S TREATMENT:  [facility] Adult PT  Treatment:                                                DATE: 06/15/21 Therapeutic Exercise: Supine shoulder flexion with stability ball 1 x 10  Chest press  with dowel 1 x 10  Bilateral shoulder ER 2 x 10 yellow band  Prone scapular retraction 2 x 10  Prone row RUE 2 x 10  Reviewed HEP Manual Therapy: Rt shoulder PROM to tolerance in all planes    PATIENT EDUCATION: Education details: see treatment above; recommended to confirm 5 or 10 lb lifting restriction at upcoming appt on Monday.  Person educated: Patient Education method: Explanation Education comprehension: verbalized understanding     HOME EXERCISE PROGRAM: Access Code: [email or web address] URL: [email or web address] Date: 06/07/2021 Prepared by: Letitia Libra   Exercises Seated Scapular Retraction - 2 x daily - 7 x weekly - 2 sets - 10 reps Seated Cervical Sidebending Stretch - 2 x daily - 7 x weekly - 3 sets - 30 sec hold Seated Levator Scapulae Stretch - 2 x daily - 7 x weekly - 3 sets - 30 sec hold Shoulder Flexion Wall Slide with Towel - 2 x daily - 7 x weekly - 1 sets - 10 reps Standing Shoulder Abduction Slides at Wall - 2 x daily - 7 x weekly - 1 sets - 10 reps Standing Shoulder External Rotation AAROM with Dowel - 2 x daily - 7 x weekly - 1 sets - 10 reps     ASSESSMENT:   CLINICAL IMPRESSION: Patient tolerated session well today focusing on progressing shoulder ROM and postural strengthening without reports of pain. She is noted to have a red, raised rash about axilla and upper arm, but denies any pruritis now. Stating that this along with the rash has improved since Dr. Myra Gianotti prescribed medication for the rash. Shoulder flexion AROM has significantly improved compared to initial evaluation, achieving 160 degrees today. She reported minimal fatigue in the RUE at the end of the session.     REHAB POTENTIAL: Excellent   CLINICAL DECISION MAKING: Evolving/moderate complexity   EVALUATION COMPLEXITY:  Moderate     GOALS: Goals reviewed with patient? No   SHORT TERM GOALS:   STG Name Target Date Goal status  1 Patient will be independent and compliant with initial HEP.    Baseline:  06/28/2021 INITIAL  2 Patient will tolerate lying flat in supine to improve her overall sleep quality.  Baseline: must utilize wedge due to pain. 07/05/2021 INITIAL  3 Patient will demonstrate at least 120 degrees of Rt shoulder flexion AROM to improve ability to reach into cabinet.  Baseline: 07/05/2021 Achieved     LONG TERM GOALS:    LTG Name Target Date Goal status  1 Patient will demonstrate at least 160 degrees of Rt shoulder flexion and abduction AROM to improve ability to reach overhead.  Baseline: 08/02/2021 INITIAL  2 Patient will demonstrate at least 60 degrees of Rt shoulder ER AROM to improve ability to complete self-care activities.  Baseline: 08/02/2021 INITIAL  3 Patient will demonstrate at least 4+/5 Rt shoulder and elbow strength to improve ability to lift and carry objects once cleared by MD for lifting.  Baseline: 08/02/2021 INITIAL  4 Patient will score at least 73% on FOTO to signify clinically meaningful improvement in function.  Baseline: 08/02/2021 INITIAL    PLAN: PT FREQUENCY: 2x/week   PT DURATION: 8 weeks   PLANNED INTERVENTIONS: Therapeutic exercises, Therapeutic activity, Neuro Muscular re-education, Patient/Family education, Joint mobilization, Dry Needling, Cryotherapy, Moist heat, Taping, and Manual therapy   PLAN FOR NEXT SESSION: review HEP, confirm lifting restrictions, gentle ROM, postural strengthening    Letitia Libra, PT, DPT, ATC 06/15/21 1:27 PM

## 2021-06-14 NOTE — Discharge Summary (Signed)
Discharge Summary  Patient ID: Candice Cruz [id number] 25 y.o. 06-01-1995  Admit date: 06/02/2021  Discharge date and time: 06/05/2021  2:49 PM   Admitting Physician: Nada Libman, MD   Discharge Physician: Nada Libman, MD  Admission Diagnoses: Venous thrombosis of upper extremity [I82.609]  Discharge Diagnoses: Venous thrombosis of upper extremity   Admission Condition: fair  Discharged Condition: good  Indication for Admission:This is a 26 year old female who initially presented with right upper extremity thoracic outlet syndrome.  She was found to have an occluded subclavian and axillary vein which were able to be thrombectomized and recanalized and subsequent treated with balloon venoplasty.  She has been maintained on anticoagulation and comes in today for first rib resection.  Hospital Course:  26 year old female was admitted on 06/02/21 and underwent right first rib resection with subclavian vein venoclysis; Ultrasound guided access, right axillary vein; Central venogram by Dr. Myra Gianotti. 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 She tolerated the procedure well and was taken to the recovery room in stable condition.  By POD#1, right upper extremity soreness was present. Had nausea overnight secondary to pain medication. Pain not well controlled. Oxycodone switched to Tramadol for pain control. Incision intact and well appearing without swelling or hematoma. Right upper extremity well perfused and warm with palpable pulse. Some mild edema of the right upper extremity. Encouraged elevation. Xarelto restarted. PT evaluated patient with recommendation for outpatient PT. Patients family had concerns about patients pain control as well as coping due to having surgery and having to change some travel plans to see her [place] in [place] They expressed concerns about patient having anxiety as a result of this and asked for some assistance. Consulted Psychiatry to help  assist. Psychiatrist, Dr. Bronwen Betters, evaluated patient and felt that patient mostly was dealing with pain management and patient declined further support. No further psychiatric services were needed during her admission.   POD#3, patient continued to have dizziness and nausea secondary to pain medication. She was very emotional and concerned regarding increased swelling and some numbness in her right arm. Arm remained well perfused and warm. There was increased edema present. Axillary incision remained clean, dry and intact without swelling or hematoma. Right upper arm was placed in sling to help elevate it. Patient again worked with PT who again recommended home health PT. Orders were placed for outpatient PT. Pain managed with IV pain medication as patient only able to tolerate.   POD#4, right upper extremity remained well perfused. Some improvement in swelling and numbness with increased elevation. Pain better managed. Meclizine given for dizziness which helped. Patient feeling better and eager to go home. PDMP reviewed and post operative pain medication sent to patients pharmacy. She will continue on Xarelto. Follow up arranged in 2 weeks with Dr. Myra Gianotti to discuss repeat venography.   Consults: psychiatry  Treatments: surgery:  #1: Right first rib resection with subclavian vein vena lysis                       #2: Ultrasound-guided access, right axillary vein                       #3: Central venogram   Disposition: Discharge disposition: 01-Home or Self Care       Patient Instructions:  Allergies as of 06/05/2021       Reactions   Amitriptyline    Other reaction(s): dizziness   Duloxetine Other (See  Comments)   Panic attacks        Medication List     TAKE these medications    Clear Eyes Complete Soln Place 1 drop into both eyes 2 (two) times daily as needed (dry, irritated, or itchy eyes).   gabapentin 400 MG capsule Commonly known as: NEURONTIN Take 400 mg by mouth  daily as needed (pain).   GARLIC PO Take 1 capsule by mouth daily.   ondansetron 4 MG/2ML Soln injection Commonly known as: ZOFRAN Inject 2 mLs (4 mg total) into the vein every 6 (six) hours as needed for nausea or vomiting.   One-A-Day Womens tablet Take 1 tablet by mouth daily.   rivaroxaban 20 MG Tabs tablet Commonly known as: XARELTO Take 1 tablet (20 mg total) by mouth daily with supper.   traMADol 50 MG tablet Commonly known as: Ultram Take 2 tablets (100 mg total) by mouth every 6 (six) hours as needed for severe pain.               Discharge Care Instructions  (From admission, onward)           Start     Ordered   06/05/21 0000  Discharge wound care:       Comments: Wash incision with mild soap and water, pat dry. Do not soak in bathtub   06/05/21 1337           Activity: activity as tolerated, no driving while on analgesics, and no heavy lifting for 6 weeks Diet: regular diet Wound Care: keep wound clean and dry, elevate right upper extremity as needed  Follow-up with Dr. Myra Gianotti in 2 weeks.  SignedGraceann Congress, PA-C 06/14/2021 4:03 PM [facility] Office: [phone]

## 2021-06-15 ENCOUNTER — Ambulatory Visit: Payer: Commercial Managed Care - HMO

## 2021-06-15 ENCOUNTER — Other Ambulatory Visit: Payer: Self-pay

## 2021-06-15 DIAGNOSIS — R293 Abnormal posture: Secondary | ICD-10-CM

## 2021-06-15 DIAGNOSIS — M6281 Muscle weakness (generalized): Secondary | ICD-10-CM

## 2021-06-15 DIAGNOSIS — M25511 Pain in right shoulder: Secondary | ICD-10-CM

## 2021-06-17 ENCOUNTER — Ambulatory Visit: Payer: Commercial Managed Care - HMO

## 2021-06-17 ENCOUNTER — Other Ambulatory Visit: Payer: Self-pay

## 2021-06-17 DIAGNOSIS — M25511 Pain in right shoulder: Secondary | ICD-10-CM | POA: Diagnosis not present

## 2021-06-17 DIAGNOSIS — R293 Abnormal posture: Secondary | ICD-10-CM

## 2021-06-17 DIAGNOSIS — M6281 Muscle weakness (generalized): Secondary | ICD-10-CM

## 2021-06-17 NOTE — Therapy (Signed)
OUTPATIENT PHYSICAL THERAPY TREATMENT NOTE   Patient Name: Candice Cruz MRN: [id number] DOB:February 23, 1996, 26 y.o., female Today's Date: 06/17/2021  PCP: Farris Has, MD REFERRING PROVIDER: Nada Libman, MD   PT End of Session - 06/17/21 1116     Visit Number 3    Number of Visits 17    Date for PT Re-Evaluation 08/05/21    Authorization Type [employer]    PT Start Time 1115    PT Stop Time 1200    PT Time Calculation (min) 45 min              Past Medical History:  Diagnosis Date   Anxiety    Depression    DVT (deep venous thrombosis) (HCC) 05/08/2021   right axillary and subclavian DVTs 05/08/21, s/p thrombectomy, venoplasty 05/09/21   Past Surgical History:  Procedure Laterality Date   APPENDECTOMY     CARDIAC CATHETERIZATION     INTRAVASCULAR ULTRASOUND/IVUS Right 05/09/2021   Procedure: Intravascular Ultrasound/IVUS;  Surgeon: Nada Libman, MD;  [facility]: [facility];  Service: Cardiovascular;  Laterality: Right;  upper extremity   LAPAROSCOPIC APPENDECTOMY N/A 05/19/2016   Procedure: APPENDECTOMY LAPAROSCOPIC;  Surgeon: Harriette Bouillon, MD;  Location: [facility] OR;  Service: General;  Laterality: N/A;   PERIPHERAL VASCULAR BALLOON ANGIOPLASTY Right 05/09/2021   Procedure: PERIPHERAL VASCULAR BALLOON ANGIOPLASTY;  Surgeon: Nada Libman, MD;  Location: [facility];  Service: Cardiovascular;  Laterality: Right;  subclavian vein   PERIPHERAL VASCULAR THROMBECTOMY Right 05/09/2021   Procedure: PERIPHERAL VASCULAR THROMBECTOMY;  Surgeon: Nada Libman, MD;  Location: [facility];  Service: Cardiovascular;  Laterality: Right;  subclavian vein   RIB RESECTION Right 06/02/2021   Procedure: RIGHT FIRST RIB RESECTION;  Surgeon: Nada Libman, MD;  Location: [facility] OR;  Service: Vascular;  Laterality: Right;   ULTRASOUND GUIDANCE FOR VASCULAR ACCESS Right 06/02/2021   Procedure: ULTRASOUND GUIDANCE FOR VASCULAR ACCESS;  Surgeon: Nada Libman, MD;   Location: [facility] OR;  Service: Vascular;  Laterality: Right;   UPPER EXTREMITY VENOGRAPHY N/A 05/09/2021   Procedure: UPPER EXTREMITY VENOGRAPHY;  Surgeon: Nada Libman, MD;  Location: [facility];  Service: Cardiovascular;  Laterality: N/A;   VENOGRAM Right 06/02/2021   Procedure: VENOGRAM RIGHT UPPER EXTREMITY;  Surgeon: Nada Libman, MD;  Location: [facility] OR;  Service: Vascular;  Laterality: Right;   Patient Active Problem List   Diagnosis Date Noted   Venous thrombosis of upper extremity 06/02/2021   DVT (deep venous thrombosis) (HCC) 05/09/2021   Acute appendicitis 05/19/2016    REFERRING DIAG: R transaxillary first rib resection secondary to thoracic outlet syndrome.  THERAPY DIAG:  Acute pain of right shoulder  Muscle weakness (generalized)  Abnormal posture  PERTINENT HISTORY: RUE DVT 05/08/21  PRECAUTIONS: per referring provider no ROM and weightbearing restriction   SUBJECTIVE: I'm feeling pretty good today and am not having any pain. The rash is doing better but I used a baby wipe yesterday on it and it irritated it a bit.  PAIN:  Are you having pain? No NPRS scale: 0/10 Pain location: N/A PAIN TYPE: N/A Pain description: N/A Aggravating factors: N/A Relieving factors: N/A OBJECTIVE:    DIAGNOSTIC FINDINGS:  CT angiography RUE: IMPRESSION: 1. Apparent high origin of the radial artery from the axillary artery with somewhat diminutive appearing radial artery, this vessel is patent to the level of the mid forearm after which there is no significant flow enhancement which is probably related to  contrast timing/inadequate opacification but patency of the distal vessel cannot be established on this exam. The remainder of the upper extremity vasculature from the axillary artery to the palmar arch on the ulnar side appears patent. 2. Mild edema within the subcutaneous soft tissues of the upper extremity without focal fluid collection.     PATIENT SURVEYS:   FOTO 52% to 73%                                 SENSATION:          Light touch: decreased sensation about posterior axilla           POSTURE: Forward head/rounded shoulders   PALPATION: Tautness and palpable tenderness Rt upper trap, levator scapulae, rhomboids   Cervical AROM: WNL all planes; mild ache with cervical extension and Rt side bend along right posterior neck   OBSERVATION:  incision site healing well with no signs of infection; no obvious swelling or discoloration about the RUE.   06/15/21: red, raised rash about axilla and upper arm; no discharge noted, incision site healing well with glue in place   UPPER EXTREMITY AROM/PROM: Left shoulder AROM WNL    A/PROM Right 06/07/2021 Left 06/07/2021 06/15/21 Right  Shoulder flexion 83   160  Shoulder extension       Shoulder abduction 110     Shoulder adduction       Shoulder internal rotation 90     Shoulder external rotation 40     Elbow flexion       Elbow extension       Wrist flexion       Wrist extension       Wrist ulnar deviation       Wrist radial deviation       Wrist pronation       Wrist supination       (Blank rows = not tested)   UPPER EXTREMITY MMT: MMT deferred secondary to post-op acuity    MMT Right 06/07/2021 Left 06/07/2021  Shoulder flexion      Shoulder extension      Shoulder abduction      Shoulder adduction      Shoulder internal rotation      Shoulder external rotation      Middle trapezius      Lower trapezius      Elbow flexion      Elbow extension      Wrist flexion      Wrist extension      Wrist ulnar deviation      Wrist radial deviation      Wrist pronation      Wrist supination      Grip strength (lbs) 45 45  (Blank rows = not tested)                TODAY'S TREATMENT:  [facility] Adult PT Treatment:                                                DATE: 06/17/2021 Therapeutic Exercise: Supine shoulder flexion with stability ball 1 x 10  Chest press with dowel 2 x  10 Bilateral shoulder ER 2 x 10 yellow band  Prone scapular retraction 2 x 10  Prone row RUE 2 x 10  Prone T lift RUE 2 x 10 Reviewed HEP Manual Therapy: Rt shoulder PROM to tolerance in all planes     [facility] Adult PT Treatment:                                                DATE: 06/15/21 Therapeutic Exercise: Supine shoulder flexion with stability ball 1 x 10  Chest press with dowel 1 x 10  Bilateral shoulder ER 2 x 10 yellow band  Prone scapular retraction 2 x 10  Prone row RUE 2 x 10  Reviewed HEP Manual Therapy: Rt shoulder PROM to tolerance in all planes    PATIENT EDUCATION: Education details: see treatment above; recommended to confirm 5 or 10 lb lifting restriction at upcoming appt on Monday.  Person educated: Patient Education method: Explanation Education comprehension: verbalized understanding     HOME EXERCISE PROGRAM: Access Code: [email or web address] URL: [email or web address] Date: 06/07/2021 Prepared by: Letitia Libra   Exercises Seated Scapular Retraction - 2 x daily - 7 x weekly - 2 sets - 10 reps Seated Cervical Sidebending Stretch - 2 x daily - 7 x weekly - 3 sets - 30 sec hold Seated Levator Scapulae Stretch - 2 x daily - 7 x weekly - 3 sets - 30 sec hold Shoulder Flexion Wall Slide with Towel - 2 x daily - 7 x weekly - 1 sets - 10 reps Standing Shoulder Abduction Slides at Wall - 2 x daily - 7 x weekly - 1 sets - 10 reps Standing Shoulder External Rotation AAROM with Dowel - 2 x daily - 7 x weekly - 1 sets - 10 reps     ASSESSMENT:   CLINICAL IMPRESSION: Patient was able to complete all prescribed exercises with no adverse effects and no reports of pain. The red, raised rash in her R axilla and upper arm is improving overall with topical and oral medication though it was irritated yesterday by use of a baby wipe. She states that she will see Dr. Myra Gianotti on Monday and confirm the lifting restrictions as well as receive a date for her next procedure.  Patient continues to benefit from skilled PT services and should be progressed as able to improve functional independence.    REHAB POTENTIAL: Excellent   CLINICAL DECISION MAKING: Evolving/moderate complexity   EVALUATION COMPLEXITY: Moderate     GOALS: Goals reviewed with patient? No   SHORT TERM GOALS:   STG Name Target Date Goal status  1 Patient will be independent and compliant with initial HEP.    Baseline:  06/28/2021 INITIAL  2 Patient will tolerate lying flat in supine to improve her overall sleep quality.  Baseline: must utilize wedge due to pain. 07/05/2021 INITIAL  3 Patient will demonstrate at least 120 degrees of Rt shoulder flexion AROM to improve ability to reach into cabinet.  Baseline: 07/05/2021 Achieved     LONG TERM GOALS:    LTG Name Target Date Goal status  1 Patient will demonstrate at least 160 degrees of Rt shoulder flexion and abduction AROM to improve ability to reach overhead.  Baseline: 08/02/2021 INITIAL  2 Patient will demonstrate at least 60 degrees of Rt shoulder ER AROM to improve ability to complete self-care activities.  Baseline: 08/02/2021 INITIAL  3 Patient will demonstrate at least 4+/5 Rt shoulder and elbow strength to improve ability to lift and carry objects  once cleared by MD for lifting.  Baseline: 08/02/2021 INITIAL  4 Patient will score at least 73% on FOTO to signify clinically meaningful improvement in function.  Baseline: 08/02/2021 INITIAL    PLAN: PT FREQUENCY: 2x/week   PT DURATION: 8 weeks   PLANNED INTERVENTIONS: Therapeutic exercises, Therapeutic activity, Neuro Muscular re-education, Patient/Family education, Joint mobilization, Dry Needling, Cryotherapy, Moist heat, Taping, and Manual therapy   PLAN FOR NEXT SESSION: review HEP, confirm lifting restrictions, gentle ROM, postural strengthening    Harland GermanStephanie Loreli Debruler, PTA 06/17/21 11:28 AM

## 2021-06-19 ENCOUNTER — Encounter: Payer: Self-pay | Admitting: Surgery

## 2021-06-19 ENCOUNTER — Other Ambulatory Visit: Payer: Self-pay

## 2021-06-19 ENCOUNTER — Ambulatory Visit (INDEPENDENT_AMBULATORY_CARE_PROVIDER_SITE_OTHER): Payer: Managed Care, Other (non HMO) | Admitting: Surgery

## 2021-06-19 VITALS — BP 111/80 | HR 80 | Temp 97.9°F | Resp 20 | Ht 65.0 in | Wt 107.0 lb

## 2021-06-19 DIAGNOSIS — I871 Compression of vein: Secondary | ICD-10-CM

## 2021-06-19 MED ORDER — LORATADINE 10 MG PO TABS: 10.0000 mg | ORAL_TABLET | Freq: Every day | ORAL | 1 refills | Status: AC

## 2021-06-19 MED ORDER — NYSTATIN 100000 UNIT/GM EX CREA
TOPICAL_CREAM | Freq: Two times a day (BID) | CUTANEOUS | Status: AC
Start: 2021-06-19 — End: ?

## 2021-06-19 NOTE — Progress Notes (Signed)
Patient name: Candice Cruz MRN: [id number] DOB: 05/19/1995 Sex: female  REASON FOR VISIT:     Post op  HISTORY OF PRESENT ILLNESS:   Candice Cruz is a 26 y.o. female who is left hand dominant who presented to the hospital on 05/09/2021 with a 2-day history of right arm swelling.  She was also found to have some discoloration of her fingers and supraclavicular tenderness.  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  The same day she underwent mechanical thrombectomy and balloon venoplasty of the right axillary, subclavian, and brachiocephalic vein.  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  She was discharged home on anticoagulation so she could travel to [place]  She was scheduled to return and then undergo first rib resection on 06/02/2021.  At the time of her first rib resection she also underwent tenolysis.  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  Attempts were made to cross the occlusion however a dissection plane was created.  It was decided that she should be brought back for a repeat attempt at a later date in an effort to try and avoid stenting.  She did develop a rash after surgery.  I gave her a steroid Dosepak which helped however she used a baby wipe and it came back.  She has recovered nicely from surgery.  She is not having any swelling in her arm  CURRENT MEDICATIONS:    Current Outpatient Medications  Medication Sig Dispense Refill   gabapentin (NEURONTIN) 400 MG capsule Take 400 mg by mouth daily as needed (pain).     GARLIC PO Take 1 capsule by mouth daily.     Hyprom-Naphaz-Polysorb-Zn Sulf (CLEAR EYES COMPLETE) SOLN Place 1 drop into both eyes 2 (two) times daily as needed (dry, irritated, or itchy eyes).     methylPREDNISolone (MEDROL DOSEPAK) 4 MG TBPK tablet Take as directed. 21 tablet 0   Multiple  Vitamins-Minerals (ONE-A-DAY WOMENS) tablet Take 1 tablet by mouth daily.     rivaroxaban (XARELTO) 20 MG TABS tablet Take 1 tablet (20 mg total) by mouth daily with supper. 30 tablet 11   traMADol (ULTRAM) 50 MG tablet Take 2 tablets (100 mg total) by mouth every 6 (six) hours as needed for severe pain. 20 tablet 0   Current Facility-Administered Medications  Medication Dose Route Frequency Provider Last Rate Last Admin   nystatin cream (MYCOSTATIN)   Topical BID Nada Libman, MD        REVIEW OF SYSTEMS:   [X]  denotes positive finding, [ ]  denotes negative finding Cardiac  Comments:  Chest pain or chest pressure:    Shortness of breath upon exertion:    Short of breath when lying flat:    Irregular heart rhythm:    Constitutional    Fever or chills:      PHYSICAL EXAM:   Vitals:   06/19/21 1112  BP: 111/80  Pulse: 80  Resp: 20  Temp: 97.9 F (36.6 C)  SpO2: 100%  Weight: 107 lb (48.5 kg)  Height: 5\' 5"  (1.651 m)    GENERAL: The patient is a well-nourished female, in no acute distress. The vital signs are documented above. CARDIOVASCULAR: There is a regular rate and rhythm. PULMONARY: Non-labored respirations No significant arm edema   STUDIES:   None   MEDICAL ISSUES:   We discussed repeating venography and attempting to recanalize  her occluded subclavian and axillary vein.  I talked about coming from the arm or possibly from the groin.  She is very anxious about this.  I think it would be best to do this in the operating room under deep sedation or general anesthesia.  She will stop her Xarelto the day before surgery.  I told her I think she needs a 3-month course of Xarelto. ° °Right arm rash: This did improve a little with a steroid Dosepak.  She is placing over-the-counter hydrocortisone over top of this.  It appears to be a allergic type process so I am also going to start her on Claritin and Benadryl ° °Wells Kinsey Karch, IV, MD, FACS °[facility] °Tel [phone] °Pager [phone] ° ° °  °

## 2021-06-19 NOTE — H&P (View-Only) (Signed)
Patient name: Candice Cruz MRN: [id number] DOB: 05/19/1995 Sex: female  REASON FOR VISIT:     Post op  HISTORY OF PRESENT ILLNESS:   Candice Cruz is a 26 y.o. female who is left hand dominant who presented to the hospital on 05/09/2021 with a 2-day history of right arm swelling.  She was also found to have some discoloration of her fingers and supraclavicular tenderness.  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  The same day she underwent mechanical thrombectomy and balloon venoplasty of the right axillary, subclavian, and brachiocephalic vein.  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  She was discharged home on anticoagulation so she could travel to [place]  She was scheduled to return and then undergo first rib resection on 06/02/2021.  At the time of her first rib resection she also underwent tenolysis.  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  Attempts were made to cross the occlusion however a dissection plane was created.  It was decided that she should be brought back for a repeat attempt at a later date in an effort to try and avoid stenting.  She did develop a rash after surgery.  I gave her a steroid Dosepak which helped however she used a baby wipe and it came back.  She has recovered nicely from surgery.  She is not having any swelling in her arm  CURRENT MEDICATIONS:    Current Outpatient Medications  Medication Sig Dispense Refill   gabapentin (NEURONTIN) 400 MG capsule Take 400 mg by mouth daily as needed (pain).     GARLIC PO Take 1 capsule by mouth daily.     Hyprom-Naphaz-Polysorb-Zn Sulf (CLEAR EYES COMPLETE) SOLN Place 1 drop into both eyes 2 (two) times daily as needed (dry, irritated, or itchy eyes).     methylPREDNISolone (MEDROL DOSEPAK) 4 MG TBPK tablet Take as directed. 21 tablet 0   Multiple  Vitamins-Minerals (ONE-A-DAY WOMENS) tablet Take 1 tablet by mouth daily.     rivaroxaban (XARELTO) 20 MG TABS tablet Take 1 tablet (20 mg total) by mouth daily with supper. 30 tablet 11   traMADol (ULTRAM) 50 MG tablet Take 2 tablets (100 mg total) by mouth every 6 (six) hours as needed for severe pain. 20 tablet 0   Current Facility-Administered Medications  Medication Dose Route Frequency Provider Last Rate Last Admin   nystatin cream (MYCOSTATIN)   Topical BID Nada Libman, MD        REVIEW OF SYSTEMS:   [X]  denotes positive finding, [ ]  denotes negative finding Cardiac  Comments:  Chest pain or chest pressure:    Shortness of breath upon exertion:    Short of breath when lying flat:    Irregular heart rhythm:    Constitutional    Fever or chills:      PHYSICAL EXAM:   Vitals:   06/19/21 1112  BP: 111/80  Pulse: 80  Resp: 20  Temp: 97.9 F (36.6 C)  SpO2: 100%  Weight: 107 lb (48.5 kg)  Height: 5\' 5"  (1.651 m)    GENERAL: The patient is a well-nourished female, in no acute distress. The vital signs are documented above. CARDIOVASCULAR: There is a regular rate and rhythm. PULMONARY: Non-labored respirations No significant arm edema   STUDIES:   None   MEDICAL ISSUES:   We discussed repeating venography and attempting to recanalize  her occluded subclavian and axillary vein.  I talked about coming from the arm or possibly from the groin.  She is very anxious about this.  I think it would be best to do this in the operating room under deep sedation or general anesthesia.  She will stop her Xarelto the day before surgery.  I told her I think she needs a 22-month course of Xarelto.  Right arm rash: This did improve a little with a steroid Dosepak.  She is placing over-the-counter hydrocortisone over top of this.  It appears to be a allergic type process so I am also going to start her on Claritin and Benadryl  Durene Cal, IV, MD, FACS [facility] [phone] Pager [phone]

## 2021-06-21 ENCOUNTER — Other Ambulatory Visit: Payer: Self-pay

## 2021-06-21 NOTE — Therapy (Signed)
OUTPATIENT PHYSICAL THERAPY TREATMENT NOTE  a Patient Name: Candice Cruz MRN: [id number] DOB:Sep 07, 1995, 26 y.o., female Today's Date: 06/22/2021  PCP: London Pepper, MD REFERRING PROVIDER: Serafina Mitchell, MD   PT End of Session - 06/22/21 1146     Visit Number 4    Number of Visits 17    Date for PT Re-Evaluation 08/05/21    Authorization Type [employer]    PT Start Time 1146    PT Stop Time 1227    PT Time Calculation (min) 41 min    Activity Tolerance Patient tolerated treatment well    Behavior During Therapy [facility] for tasks assessed/performed               Past Medical History:  Diagnosis Date   Anxiety    Depression    DVT (deep venous thrombosis) ([place]) 05/08/2021   right axillary and subclavian DVTs 05/08/21, s/p thrombectomy, venoplasty 05/09/21   Past Surgical History:  Procedure Laterality Date   APPENDECTOMY     CARDIAC CATHETERIZATION     INTRAVASCULAR ULTRASOUND/IVUS Right 05/09/2021   Procedure: Intravascular Ultrasound/IVUS;  Surgeon: Serafina Mitchell, MD;  Location: [place] [facility];  Service: Cardiovascular;  Laterality: Right;  upper extremity   LAPAROSCOPIC APPENDECTOMY N/A 05/19/2016   Procedure: APPENDECTOMY LAPAROSCOPIC;  Surgeon: Erroll Luna, MD;  Location: [place];  Service: General;  Laterality: N/A;   PERIPHERAL VASCULAR BALLOON ANGIOPLASTY Right 05/09/2021   Procedure: PERIPHERAL VASCULAR BALLOON ANGIOPLASTY;  Surgeon: Serafina Mitchell, MD;  Location: [place] [facility];  Service: Cardiovascular;  Laterality: Right;  subclavian vein   PERIPHERAL VASCULAR THROMBECTOMY Right 05/09/2021   Procedure: PERIPHERAL VASCULAR THROMBECTOMY;  Surgeon: Serafina Mitchell, MD;  Location: [place] [facility];  Service: Cardiovascular;  Laterality: Right;  subclavian vein   RIB RESECTION Right 06/02/2021   Procedure: RIGHT FIRST RIB RESECTION;  Surgeon: Serafina Mitchell, MD;  Location: [facility];  Service: Vascular;  Laterality: Right;   ULTRASOUND GUIDANCE FOR  VASCULAR ACCESS Right 06/02/2021   Procedure: ULTRASOUND GUIDANCE FOR VASCULAR ACCESS;  Surgeon: Serafina Mitchell, MD;  Location: [facility];  Service: Vascular;  Laterality: Right;   UPPER EXTREMITY VENOGRAPHY N/A 05/09/2021   Procedure: UPPER EXTREMITY VENOGRAPHY;  Surgeon: Serafina Mitchell, MD;  Location: [place] [facility];  Service: Cardiovascular;  Laterality: N/A;   VENOGRAM Right 06/02/2021   Procedure: VENOGRAM RIGHT UPPER EXTREMITY;  Surgeon: Serafina Mitchell, MD;  Location: [facility];  Service: Vascular;  Laterality: Right;   Patient Active Problem List   Diagnosis Date Noted   Venous thrombosis of upper extremity 06/02/2021   DVT (deep venous thrombosis) ([place]) 05/09/2021   Acute appendicitis 05/19/2016    REFERRING DIAG: R transaxillary first rib resection secondary to thoracic outlet syndrome.  THERAPY DIAG:  Acute pain of right shoulder  Muscle weakness (generalized)  Abnormal posture  PERTINENT HISTORY: RUE DVT 05/08/21  PRECAUTIONS: per referring provider no ROM and weightbearing restriction   SUBJECTIVE:  Patient had f/u with vascular surgeon on Monday and has surgery scheduled on 06/30/21 for UE DVT. She spoke with him regarding her lifting restrictions and was instructed that her lifting restrictions are in place for 6 weeks, but she is still unsure if it is a 5 or 10 lb lifting restriction. She reports no longer needing to use a wedge to sleep on.   PAIN:  Are you having pain? No NPRS scale: 0/10 Pain location: N/A PAIN TYPE: N/A Pain description: N/A Aggravating factors: N/A  Relieving factors: N/A OBJECTIVE:    DIAGNOSTIC FINDINGS:  CT angiography RUE: IMPRESSION: 1. Apparent high origin of the radial artery from the axillary artery with somewhat diminutive appearing radial artery, this vessel is patent to the level of the mid forearm after which there is no significant flow enhancement which is probably related to contrast timing/inadequate opacification but patency  of the distal vessel cannot be established on this exam. The remainder of the upper extremity vasculature from the axillary artery to the palmar arch on the ulnar side appears patent. 2. Mild edema within the subcutaneous soft tissues of the upper extremity without focal fluid collection.     PATIENT SURVEYS:  FOTO 52% to 73%                                 SENSATION:          Light touch: decreased sensation about posterior axilla           POSTURE: Forward head/rounded shoulders   PALPATION: [place] and palpable tenderness Rt upper trap, levator scapulae, rhomboids   Cervical AROM: WNL all planes; mild ache with cervical extension and Rt side bend along right posterior neck   OBSERVATION:  incision site healing well with no signs of infection; no obvious swelling or discoloration about the RUE.   06/15/21: red, raised rash about axilla and upper arm; no discharge noted, incision site healing well with glue in place   UPPER EXTREMITY AROM/PROM: Left shoulder AROM WNL    A/PROM Right 06/07/2021 Left 06/07/2021 06/15/21 Right 06/22/21 Rt   Shoulder flexion 83   160   Shoulder extension        Shoulder abduction 110    180  Shoulder adduction        Shoulder internal rotation 90      Shoulder external rotation 40      Elbow flexion        Elbow extension        Wrist flexion        Wrist extension        Wrist ulnar deviation        Wrist radial deviation        Wrist pronation        Wrist supination        (Blank rows = not tested)   UPPER EXTREMITY MMT: MMT deferred secondary to post-op acuity    MMT Right 06/07/2021 Left 06/07/2021  Shoulder flexion      Shoulder extension      Shoulder abduction      Shoulder adduction      Shoulder internal rotation      Shoulder external rotation      Middle trapezius      Lower trapezius      Elbow flexion      Elbow extension      Wrist flexion      Wrist extension      Wrist ulnar deviation      Wrist radial deviation       Wrist pronation      Wrist supination      Grip strength (lbs) 45 45  (Blank rows = not tested)                TODAY'S TREATMENT:  [facility] Adult PT Treatment:  DATE: 06/22/21 Therapeutic Exercise: Serratus punch with stability ball 2 x 10  Prone T 2 x 10  Prone W 2 x 10  Prone Y 2 x 10  Sidelying ER 2 x 15 RUE  Serratus wall slides 2 x 10  Wall taps 2 x 10  Prone opposite arm and leg extension 2 x 10  Updated HEP   [facility] Adult PT Treatment:                                                DATE: 06/17/2021 Therapeutic Exercise: Supine shoulder flexion with stability ball 1 x 10  Chest press with dowel 2 x 10 Bilateral shoulder ER 2 x 10 yellow band  Prone scapular retraction 2 x 10  Prone row RUE 2 x 10  Prone T lift RUE 2 x 10 Reviewed HEP Manual Therapy: Rt shoulder PROM to tolerance in all planes     [facility] Adult PT Treatment:                                                DATE: 06/15/21 Therapeutic Exercise: Supine shoulder flexion with stability ball 1 x 10  Chest press with dowel 1 x 10  Bilateral shoulder ER 2 x 10 yellow band  Prone scapular retraction 2 x 10  Prone row RUE 2 x 10  Reviewed HEP Manual Therapy: Rt shoulder PROM to tolerance in all planes    PATIENT EDUCATION: Education details: see treatment above;  Person educated: Patient Education method: Explanation, demo, verbal cues, handout Education comprehension: verbalized understanding, returned demo,verbal cues      HOME EXERCISE PROGRAM: Access Code: [email or web address] URL: [email or web address] Date: 06/07/2021 Prepared by: Letitia LibraSamantha Kadesia Robel   Exercises Seated Scapular Retraction - 2 x daily - 7 x weekly - 2 sets - 10 reps Seated Cervical Sidebending Stretch - 2 x daily - 7 x weekly - 3 sets - 30 sec hold Seated Levator Scapulae Stretch - 2 x daily - 7 x weekly - 3 sets - 30 sec hold Shoulder Flexion Wall Slide with Towel - 2 x daily - 7 x weekly  - 1 sets - 10 reps Standing Shoulder Abduction Slides at Wall - 2 x daily - 7 x weekly - 1 sets - 10 reps Standing Shoulder External Rotation AAROM with Dowel - 2 x daily - 7 x weekly - 1 sets - 10 reps     ASSESSMENT:   CLINICAL IMPRESSION:  Patient tolerated session well today with continued focus on periscapular/shoulder strengthening. Continued strengthening without weights at this time as she is still unsure of her current lifting restrictions.  PT has reached out to vascular surgeon and is awaiting response to confirm lifting restrictions at this time.  Shoulder abduction AROM has significantly improved compared to baseline, demonstrating full and pain free range today. No reports of pain throughout session.    REHAB POTENTIAL: Excellent   CLINICAL DECISION MAKING: Evolving/moderate complexity   EVALUATION COMPLEXITY: Moderate     GOALS: Goals reviewed with patient? No   SHORT TERM GOALS:   STG Name Target Date Goal status  1 Patient will be independent and compliant with initial HEP.    Baseline:  06/28/2021 INITIAL  2 Patient  will tolerate lying flat in supine to improve her overall sleep quality.  Baseline: must utilize wedge due to pain. 07/05/2021 Achieved   3 Patient will demonstrate at least 120 degrees of Rt shoulder flexion AROM to improve ability to reach into cabinet.  Baseline: 07/05/2021 Achieved     LONG TERM GOALS:    LTG Name Target Date Goal status  1 Patient will demonstrate at least 160 degrees of Rt shoulder flexion and abduction AROM to improve ability to reach overhead.  Baseline: 08/02/2021 INITIAL  2 Patient will demonstrate at least 60 degrees of Rt shoulder ER AROM to improve ability to complete self-care activities.  Baseline: 08/02/2021 INITIAL  3 Patient will demonstrate at least 4+/5 Rt shoulder and elbow strength to improve ability to lift and carry objects once cleared by MD for lifting.  Baseline: 08/02/2021 INITIAL  4 Patient will score at least 73%  on FOTO to signify clinically meaningful improvement in function.  Baseline: 08/02/2021 INITIAL    PLAN: PT FREQUENCY: 2x/week   PT DURATION: 8 weeks   PLANNED INTERVENTIONS: Therapeutic exercises, Therapeutic activity, Neuro Muscular re-education, Patient/Family education, Joint mobilization, Dry Needling, Cryotherapy, Moist heat, Taping, and Manual therapy   PLAN FOR NEXT SESSION: review HEP, confirm lifting restrictions, postural/shoulder strengthening   Gwendolyn Grant, PT, DPT, ATC 06/22/21 12:29 PM

## 2021-06-22 ENCOUNTER — Ambulatory Visit: Payer: Commercial Managed Care - HMO

## 2021-06-22 ENCOUNTER — Other Ambulatory Visit: Payer: Self-pay

## 2021-06-22 DIAGNOSIS — M6281 Muscle weakness (generalized): Secondary | ICD-10-CM

## 2021-06-22 DIAGNOSIS — M25511 Pain in right shoulder: Secondary | ICD-10-CM | POA: Diagnosis not present

## 2021-06-22 DIAGNOSIS — R293 Abnormal posture: Secondary | ICD-10-CM

## 2021-06-23 ENCOUNTER — Encounter: Payer: Self-pay | Admitting: Surgery

## 2021-06-24 ENCOUNTER — Ambulatory Visit: Payer: Commercial Managed Care - HMO

## 2021-06-24 ENCOUNTER — Other Ambulatory Visit: Payer: Self-pay

## 2021-06-24 DIAGNOSIS — M6281 Muscle weakness (generalized): Secondary | ICD-10-CM

## 2021-06-24 DIAGNOSIS — M25511 Pain in right shoulder: Secondary | ICD-10-CM | POA: Diagnosis not present

## 2021-06-24 DIAGNOSIS — R293 Abnormal posture: Secondary | ICD-10-CM

## 2021-06-24 NOTE — Therapy (Signed)
OUTPATIENT PHYSICAL THERAPY TREATMENT NOTE  Patient Name: Candice Cruz MRN: [id number] DOB:May 17, 1995, 26 y.o., female Today's Date: 06/24/2021  PCP: Farris Has, MD REFERRING PROVIDER: Nada Libman, MD   PT End of Session - 06/24/21 [phone]     Visit Number 5    Number of Visits 17    Date for PT Re-Evaluation 08/05/21    Authorization Type [employer]    PT Start Time [phone]    PT Stop Time 0932    PT Time Calculation (min) 41 min    Activity Tolerance Patient tolerated treatment well    Behavior During Therapy [facility] for tasks assessed/performed                Past Medical History:  Diagnosis Date   Anxiety    Depression    DVT (deep venous thrombosis) (HCC) 05/08/2021   right axillary and subclavian DVTs 05/08/21, s/p thrombectomy, venoplasty 05/09/21   Past Surgical History:  Procedure Laterality Date   APPENDECTOMY     CARDIAC CATHETERIZATION     INTRAVASCULAR ULTRASOUND/IVUS Right 05/09/2021   Procedure: Intravascular Ultrasound/IVUS;  Surgeon: Nada Libman, MD;  [facility]: [facility];  Service: Cardiovascular;  Laterality: Right;  upper extremity   LAPAROSCOPIC APPENDECTOMY N/A 05/19/2016   Procedure: APPENDECTOMY LAPAROSCOPIC;  Surgeon: Harriette Bouillon, MD;  Location: [facility] OR;  Service: General;  Laterality: N/A;   PERIPHERAL VASCULAR BALLOON ANGIOPLASTY Right 05/09/2021   Procedure: PERIPHERAL VASCULAR BALLOON ANGIOPLASTY;  Surgeon: Nada Libman, MD;  [facility]: [facility];  Service: Cardiovascular;  Laterality: Right;  subclavian vein   PERIPHERAL VASCULAR THROMBECTOMY Right 05/09/2021   Procedure: PERIPHERAL VASCULAR THROMBECTOMY;  Surgeon: Nada Libman, MD;  [facility];  Service: Cardiovascular;  Laterality: Right;  subclavian vein   RIB RESECTION Right 06/02/2021   Procedure: RIGHT FIRST RIB RESECTION;  Surgeon: Nada Libman, MD;  Location: [facility] OR;  Service: Vascular;  Laterality: Right;   ULTRASOUND GUIDANCE FOR  VASCULAR ACCESS Right 06/02/2021   Procedure: ULTRASOUND GUIDANCE FOR VASCULAR ACCESS;  Surgeon: Nada Libman, MD;  Location: [facility] OR;  Service: Vascular;  Laterality: Right;   UPPER EXTREMITY VENOGRAPHY N/A 05/09/2021   Procedure: UPPER EXTREMITY VENOGRAPHY;  Surgeon: Nada Libman, MD;  [facility];  Service: Cardiovascular;  Laterality: N/A;   VENOGRAM Right 06/02/2021   Procedure: VENOGRAM RIGHT UPPER EXTREMITY;  Surgeon: Nada Libman, MD;  Location: [facility] OR;  Service: Vascular;  Laterality: Right;   Patient Active Problem List   Diagnosis Date Noted   Venous thrombosis of upper extremity 06/02/2021   DVT (deep venous thrombosis) (HCC) 05/09/2021   Acute appendicitis 05/19/2016    REFERRING DIAG: R transaxillary first rib resection secondary to thoracic outlet syndrome.  THERAPY DIAG:  Acute pain of right shoulder  Muscle weakness (generalized)  Abnormal posture  PERTINENT HISTORY: RUE DVT 05/08/21  PRECAUTIONS: per referring provider no ROM and weightbearing restriction   SUBJECTIVE: I'm not having any pain today, I'm just a bit tired. I'm a little concerned because I saw in MyChart that my surgery says "stent placement" and I was told multiple times that there wouldn't be a stent. I have messaged the surgeon on MyChart but haven't heard back. I tried to call but the assistant wasn't very helpful.  PAIN:  Are you having pain? No NPRS scale: 0/10 Pain location: N/A PAIN TYPE: N/A Pain description: N/A Aggravating factors: N/A Relieving factors: N/A OBJECTIVE:    DIAGNOSTIC  FINDINGS:  CT angiography RUE: IMPRESSION: 1. Apparent high origin of the radial artery from the axillary artery with somewhat diminutive appearing radial artery, this vessel is patent to the level of the mid forearm after which there is no significant flow enhancement which is probably related to contrast timing/inadequate opacification but patency of the distal vessel cannot be  established on this exam. The remainder of the upper extremity vasculature from the axillary artery to the palmar arch on the ulnar side appears patent. 2. Mild edema within the subcutaneous soft tissues of the upper extremity without focal fluid collection.     PATIENT SURVEYS:  FOTO 52% to 73%                                 SENSATION:          Light touch: decreased sensation about posterior axilla           POSTURE: Forward head/rounded shoulders   PALPATION: Tautness and palpable tenderness Rt upper trap, levator scapulae, rhomboids   Cervical AROM: WNL all planes; mild ache with cervical extension and Rt side bend along right posterior neck   OBSERVATION:  incision site healing well with no signs of infection; no obvious swelling or discoloration about the RUE.   06/15/21: red, raised rash about axilla and upper arm; no discharge noted, incision site healing well with glue in place   UPPER EXTREMITY AROM/PROM: Left shoulder AROM WNL    A/PROM Right 06/07/2021 Left 06/07/2021 06/15/21 Right 06/22/21 Rt   Shoulder flexion 83   160   Shoulder extension        Shoulder abduction 110    180  Shoulder adduction        Shoulder internal rotation 90      Shoulder external rotation 40      Elbow flexion        Elbow extension        Wrist flexion        Wrist extension        Wrist ulnar deviation        Wrist radial deviation        Wrist pronation        Wrist supination        (Blank rows = not tested)   UPPER EXTREMITY MMT: MMT deferred secondary to post-op acuity    MMT Right 06/07/2021 Left 06/07/2021  Shoulder flexion      Shoulder extension      Shoulder abduction      Shoulder adduction      Shoulder internal rotation      Shoulder external rotation      Middle trapezius      Lower trapezius      Elbow flexion      Elbow extension      Wrist flexion      Wrist extension      Wrist ulnar deviation      Wrist radial deviation      Wrist pronation      Wrist  supination      Grip strength (lbs) 45 45  (Blank rows = not tested)                TODAY'S TREATMENT:  [facility] Adult PT Treatment:  DATE: 06/24/2021 Therapeutic Exercise: Prone T 2 x 10  Prone W 2 x 10  Prone Y 2 x 10  Bilateral shoulder ER 2 x 10 yellow band  Sidelying ER 2 x 15 RUE  Prone opposite arm and leg extension x 10 BIL Chest press with dowel 2 x 10 Seated shoulder flexion with dowel 2 x 10 Seated bicep curl with dowel 2 x 10 Red PB roll up wall, lift off, 2 x 10   [facility] Adult PT Treatment:                                                DATE: 06/22/21 Therapeutic Exercise: Serratus punch with stability ball 2 x 10  Prone T 2 x 10  Prone W 2 x 10  Prone Y 2 x 10  Sidelying ER 2 x 15 RUE  Serratus wall slides 2 x 10  Wall taps 2 x 10  Prone opposite arm and leg extension 2 x 10  Updated HEP    [facility] Adult PT Treatment:                                                DATE: 06/17/2021 Therapeutic Exercise: Supine shoulder flexion with stability ball 1 x 10  Chest press with dowel 2 x 10 Bilateral shoulder ER 2 x 10 yellow band  Prone scapular retraction 2 x 10  Prone row RUE 2 x 10  Prone T lift RUE 2 x 10 Reviewed HEP Manual Therapy: Rt shoulder PROM to tolerance in all planes      PATIENT EDUCATION: Education details: see treatment above;  Person educated: Patient Education method: Explanation, demo, verbal cues, handout Education comprehension: verbalized understanding, returned demo,verbal cues      HOME EXERCISE PROGRAM: Access Code: [email or web address] URL: [email or web address] Date: 06/07/2021 Prepared by: Letitia Libra   Exercises Seated Scapular Retraction - 2 x daily - 7 x weekly - 2 sets - 10 reps Seated Cervical Sidebending Stretch - 2 x daily - 7 x weekly - 3 sets - 30 sec hold Seated Levator Scapulae Stretch - 2 x daily - 7 x weekly - 3 sets - 30 sec hold Shoulder Flexion Wall Slide with  Towel - 2 x daily - 7 x weekly - 1 sets - 10 reps Standing Shoulder Abduction Slides at Wall - 2 x daily - 7 x weekly - 1 sets - 10 reps Standing Shoulder External Rotation AAROM with Dowel - 2 x daily - 7 x weekly - 1 sets - 10 reps     ASSESSMENT:   CLINICAL IMPRESSION: Patient presents to PT with no pain and session continued focus on shoulder and periscapular strengthening without weights as she is still unsure of her lifting restrictions. She is attempting to contact her doctor/surgeon for clarification on lifting restrictions as well as clarification on her next surgery as she noticed her MyChart stated "stent placement" when she was specifically told there would not be a stent placement. She was able to complete all exercises today with no reports of increased pain. Patient continues to benefit from skilled PT services and should be progressed as able to improve functional independence.    REHAB POTENTIAL: Excellent   CLINICAL DECISION  MAKING: Evolving/moderate complexity   EVALUATION COMPLEXITY: Moderate     GOALS: Goals reviewed with patient? No   SHORT TERM GOALS:   STG Name Target Date Goal status  1 Patient will be independent and compliant with initial HEP.    Baseline:  06/28/2021 INITIAL  2 Patient will tolerate lying flat in supine to improve her overall sleep quality.  Baseline: must utilize wedge due to pain. 07/05/2021 Achieved   3 Patient will demonstrate at least 120 degrees of Rt shoulder flexion AROM to improve ability to reach into cabinet.  Baseline: 07/05/2021 Achieved     LONG TERM GOALS:    LTG Name Target Date Goal status  1 Patient will demonstrate at least 160 degrees of Rt shoulder flexion and abduction AROM to improve ability to reach overhead.  Baseline: 08/02/2021 INITIAL  2 Patient will demonstrate at least 60 degrees of Rt shoulder ER AROM to improve ability to complete self-care activities.  Baseline: 08/02/2021 INITIAL  3 Patient will demonstrate at  least 4+/5 Rt shoulder and elbow strength to improve ability to lift and carry objects once cleared by MD for lifting.  Baseline: 08/02/2021 INITIAL  4 Patient will score at least 73% on FOTO to signify clinically meaningful improvement in function.  Baseline: 08/02/2021 INITIAL    PLAN: PT FREQUENCY: 2x/week   PT DURATION: 8 weeks   PLANNED INTERVENTIONS: Therapeutic exercises, Therapeutic activity, Neuro Muscular re-education, Patient/Family education, Joint mobilization, Dry Needling, Cryotherapy, Moist heat, Taping, and Manual therapy   PLAN FOR NEXT SESSION: review HEP, confirm lifting restrictions, postural/shoulder strengthening    Harland GermanStephanie Maitlyn Penza, PTA 06/24/21 9:29 AM

## 2021-06-26 ENCOUNTER — Telehealth: Payer: Self-pay

## 2021-06-26 ENCOUNTER — Encounter: Payer: Managed Care, Other (non HMO) | Admitting: Surgery

## 2021-06-26 NOTE — Telephone Encounter (Signed)
She has called several times and sent this message. Please advise. Thanks, Harriett Sine

## 2021-06-26 NOTE — Telephone Encounter (Signed)
Pt called with questions about surgery she has scheduled this week, in regards to stenting. She did not think MD would be stenting. Per MD, there is a possibility of this and will try to be avoided, if possible. Pt is aware and understands and has no further questions at this time.

## 2021-06-27 NOTE — Therapy (Signed)
OUTPATIENT PHYSICAL THERAPY TREATMENT NOTE  Patient Name: Candice Cruz MRN: [id number] DOB:04-Aug-1995, 26 y.o., female Today's Date: 06/28/2021  PCP: Farris Has, MD REFERRING PROVIDER: Nada Libman, MD   PT End of Session - 06/28/21 1150     Visit Number 6    Number of Visits 17    Date for PT Re-Evaluation 08/05/21    Authorization Type [employer]    PT Start Time 1149    PT Stop Time 1230    PT Time Calculation (min) 41 min    Activity Tolerance Patient tolerated treatment well    Behavior During Therapy [facility] for tasks assessed/performed                 Past Medical History:  Diagnosis Date   Anxiety    Depression    DVT (deep venous thrombosis) (HCC) 05/08/2021   right axillary and subclavian DVTs 05/08/21, s/p thrombectomy, venoplasty 05/09/21   Past Surgical History:  Procedure Laterality Date   APPENDECTOMY     CARDIAC CATHETERIZATION     INTRAVASCULAR ULTRASOUND/IVUS Right 05/09/2021   Procedure: Intravascular Ultrasound/IVUS;  Surgeon: Nada Libman, MD;  [facility]: [facility];  Service: Cardiovascular;  Laterality: Right;  upper extremity   LAPAROSCOPIC APPENDECTOMY N/A 05/19/2016   Procedure: APPENDECTOMY LAPAROSCOPIC;  Surgeon: Harriette Bouillon, MD;  Location: [facility] OR;  Service: General;  Laterality: N/A;   PERIPHERAL VASCULAR BALLOON ANGIOPLASTY Right 05/09/2021   Procedure: PERIPHERAL VASCULAR BALLOON ANGIOPLASTY;  Surgeon: Nada Libman, MD;  [facility]: [facility];  Service: Cardiovascular;  Laterality: Right;  subclavian vein   PERIPHERAL VASCULAR THROMBECTOMY Right 05/09/2021   Procedure: PERIPHERAL VASCULAR THROMBECTOMY;  Surgeon: Nada Libman, MD;  [facility];  Service: Cardiovascular;  Laterality: Right;  subclavian vein   RIB RESECTION Right 06/02/2021   Procedure: RIGHT FIRST RIB RESECTION;  Surgeon: Nada Libman, MD;  Location: [facility] OR;  Service: Vascular;  Laterality: Right;   ULTRASOUND GUIDANCE FOR  VASCULAR ACCESS Right 06/02/2021   Procedure: ULTRASOUND GUIDANCE FOR VASCULAR ACCESS;  Surgeon: Nada Libman, MD;  Location: [facility] OR;  Service: Vascular;  Laterality: Right;   UPPER EXTREMITY VENOGRAPHY N/A 05/09/2021   Procedure: UPPER EXTREMITY VENOGRAPHY;  Surgeon: Nada Libman, MD;  [facility]: [facility];  Service: Cardiovascular;  Laterality: N/A;   VENOGRAM Right 06/02/2021   Procedure: VENOGRAM RIGHT UPPER EXTREMITY;  Surgeon: Nada Libman, MD;  Location: [facility] OR;  Service: Vascular;  Laterality: Right;   Patient Active Problem List   Diagnosis Date Noted   Venous thrombosis of upper extremity 06/02/2021   DVT (deep venous thrombosis) (HCC) 05/09/2021   Acute appendicitis 05/19/2016    REFERRING DIAG: R transaxillary first rib resection secondary to thoracic outlet syndrome.  THERAPY DIAG:  Acute pain of right shoulder  Muscle weakness (generalized)  Abnormal posture  PERTINENT HISTORY: RUE DVT 05/08/21  PRECAUTIONS: 10 lb lifting restriction; no ROM and weightbearing restriction   SUBJECTIVE:  Patient reports she is doing well without reports of pain. She has venogram with venoplasty scheduled on 06/30/21 and plans to begin work next week.  PAIN:  Are you having pain? No NPRS scale: 0/10 Pain location: N/A PAIN TYPE: N/A Pain description: N/A Aggravating factors: N/A Relieving factors: N/A OBJECTIVE:    DIAGNOSTIC FINDINGS:  CT angiography RUE: IMPRESSION: 1. Apparent high origin of the radial artery from the axillary artery with somewhat diminutive appearing radial artery, this vessel is patent to the level  of the mid forearm after which there is no significant flow enhancement which is probably related to contrast timing/inadequate opacification but patency of the distal vessel cannot be established on this exam. The remainder of the upper extremity vasculature from the axillary artery to the palmar arch on the ulnar side appears patent. 2. Mild  edema within the subcutaneous soft tissues of the upper extremity without focal fluid collection.     PATIENT SURVEYS:  FOTO 52% to 73% 06/28/21: 72% function                                  SENSATION:          Light touch: decreased sensation about posterior axilla           POSTURE: Forward head/rounded shoulders   PALPATION: Tautness and palpable tenderness Rt upper trap, levator scapulae, rhomboids   Cervical AROM: WNL all planes; mild ache with cervical extension and Rt side bend along right posterior neck   OBSERVATION:  incision site healing well with no signs of infection; no obvious swelling or discoloration about the RUE.   06/15/21: red, raised rash about axilla and upper arm; no discharge noted, incision site healing well with glue in place   UPPER EXTREMITY AROM/PROM: Left shoulder AROM WNL    A/PROM Right 06/07/2021 Left 06/07/2021 06/15/21 Right 06/22/21 Rt   Shoulder flexion 83   160   Shoulder extension        Shoulder abduction 110    180  Shoulder adduction        Shoulder internal rotation 90      Shoulder external rotation 40      Elbow flexion        Elbow extension        Wrist flexion        Wrist extension        Wrist ulnar deviation        Wrist radial deviation        Wrist pronation        Wrist supination        (Blank rows = not tested)   UPPER EXTREMITY MMT: MMT deferred secondary to post-op acuity    MMT Right 06/07/2021 Left 06/07/2021  Shoulder flexion      Shoulder extension      Shoulder abduction      Shoulder adduction      Shoulder internal rotation      Shoulder external rotation      Middle trapezius      Lower trapezius      Elbow flexion      Elbow extension      Wrist flexion      Wrist extension      Wrist ulnar deviation      Wrist radial deviation      Wrist pronation      Wrist supination      Grip strength (lbs) 45 45  (Blank rows = not tested)                TODAY'S TREATMENT:  [facility] Adult PT Treatment:                                                 DATE: 06/28/21 Therapeutic Exercise: Standing  resisted shoulder extension 2 x 10; yellow band  Standing rows 2 x 10; red band  Resisted shoulder flexion 2 x 10; 2 lbs Resisted shoulder scaption 2 x 10; 2 lbs  Resisted shoulder abduction 2 x 10; 1 lb  Curl to overhead press 2 x 10; 1 lb  Prone T 2 x 10; 1 lb    [facility] Adult PT Treatment:                                                DATE: 06/24/2021 Therapeutic Exercise: Prone T 2 x 10  Prone W 2 x 10  Prone Y 2 x 10  Bilateral shoulder ER 2 x 10 yellow band  Sidelying ER 2 x 15 RUE  Prone opposite arm and leg extension x 10 BIL Chest press with dowel 2 x 10 Seated shoulder flexion with dowel 2 x 10 Seated bicep curl with dowel 2 x 10 Red PB roll up wall, lift off, 2 x 10   [facility] Adult PT Treatment:                                                DATE: 2//23/23 Therapeutic Exercise: Serratus punch with stability ball 2 x 10  Prone T 2 x 10  Prone W 2 x 10  Prone Y 2 x 10  Sidelying ER 2 x 15 RUE  Serratus wall slides 2 x 10  Wall taps 2 x 10  Prone opposite arm and leg extension 2 x 10  Updated HEP    [facility] Adult PT Treatment:                                                DATE: 06/17/2021 Therapeutic Exercise: Supine shoulder flexion with stability ball 1 x 10  Chest press with dowel 2 x 10 Bilateral shoulder ER 2 x 10 yellow band  Prone scapular retraction 2 x 10  Prone row RUE 2 x 10  Prone T lift RUE 2 x 10 Reviewed HEP Manual Therapy: Rt shoulder PROM to tolerance in all planes      PATIENT EDUCATION: Education details: FOTO score  Person educated: Patient Education method: Explanation Education comprehension: verbalized understanding      HOME EXERCISE PROGRAM: Access Code: [email or web address] URL: [email or web address] Date: 06/07/2021 Prepared by: Letitia LibraSamantha Wandalene Abrams   Exercises Seated Scapular Retraction - 2 x daily - 7 x weekly - 2 sets - 10 reps Seated  Cervical Sidebending Stretch - 2 x daily - 7 x weekly - 3 sets - 30 sec hold Seated Levator Scapulae Stretch - 2 x daily - 7 x weekly - 3 sets - 30 sec hold Shoulder Flexion Wall Slide with Towel - 2 x daily - 7 x weekly - 1 sets - 10 reps Standing Shoulder Abduction Slides at Wall - 2 x daily - 7 x weekly - 1 sets - 10 reps Standing Shoulder External Rotation AAROM with Dowel - 2 x daily - 7 x weekly - 1 sets - 10 reps     ASSESSMENT:   CLINICAL IMPRESSION: Able to progress  shoulder/periscapular strengthening today while maintaining 10 lb lifting restriction. She tolerated progression well without reports of pain, though requires consistent postural cueing throughout session. She reported muscle fatigue at the end of the session. She is set to undergo further surgical treatment of her UE DVT on 06/30/21 and at this time PT will be placed on hold until clearance from surgeon to resume PT following this procedure.     REHAB POTENTIAL: Excellent   CLINICAL DECISION MAKING: Evolving/moderate complexity   EVALUATION COMPLEXITY: Moderate     GOALS: Goals reviewed with patient? No   SHORT TERM GOALS:   STG Name Target Date Goal status  1 Patient will be independent and compliant with initial HEP.    Baseline:  06/28/2021 Achieved   2 Patient will tolerate lying flat in supine to improve her overall sleep quality.  Baseline: must utilize wedge due to pain. 07/05/2021 Achieved   3 Patient will demonstrate at least 120 degrees of Rt shoulder flexion AROM to improve ability to reach into cabinet.  Baseline: 07/05/2021 Achieved     LONG TERM GOALS:    LTG Name Target Date Goal status  1 Patient will demonstrate at least 160 degrees of Rt shoulder flexion and abduction AROM to improve ability to reach overhead.  Baseline: 08/02/2021 INITIAL  2 Patient will demonstrate at least 60 degrees of Rt shoulder ER AROM to improve ability to complete self-care activities.  Baseline: 08/02/2021 INITIAL  3  Patient will demonstrate at least 4+/5 Rt shoulder and elbow strength to improve ability to lift and carry objects once cleared by MD for lifting.  Baseline: 08/02/2021 INITIAL  4 Patient will score at least 73% on FOTO to signify clinically meaningful improvement in function.  Baseline: 08/02/2021 INITIAL    PLAN: PT FREQUENCY: 2x/week   PT DURATION: 8 weeks   PLANNED INTERVENTIONS: Therapeutic exercises, Therapeutic activity, Neuro Muscular re-education, Patient/Family education, Joint mobilization, Dry Needling, Cryotherapy, Moist heat, Taping, and Manual therapy   PLAN FOR NEXT SESSION: re-eval given change in status following surgical intervention on 06/30/21   Letitia Libra, PT, DPT, ATC 06/28/21 12:42 PM

## 2021-06-28 ENCOUNTER — Encounter (HOSPITAL_COMMUNITY): Payer: Self-pay | Admitting: Radiology

## 2021-06-28 ENCOUNTER — Other Ambulatory Visit: Payer: Self-pay

## 2021-06-28 ENCOUNTER — Ambulatory Visit: Payer: Commercial Managed Care - HMO | Attending: Surgery

## 2021-06-28 DIAGNOSIS — M6281 Muscle weakness (generalized): Secondary | ICD-10-CM | POA: Diagnosis present

## 2021-06-28 DIAGNOSIS — R293 Abnormal posture: Secondary | ICD-10-CM | POA: Insufficient documentation

## 2021-06-28 DIAGNOSIS — M25511 Pain in right shoulder: Secondary | ICD-10-CM | POA: Diagnosis not present

## 2021-06-29 ENCOUNTER — Other Ambulatory Visit: Payer: Self-pay

## 2021-06-29 ENCOUNTER — Encounter (HOSPITAL_COMMUNITY): Payer: Self-pay | Admitting: Surgery

## 2021-06-29 NOTE — Progress Notes (Signed)
Candice Cruz denies chest pain or shortness of breath. Patient denies having any s/s of Covid in her household.  Patient denies any known exposure to Covid. ? ?PCP is Dr. Clifton Custard Marrow. ? ?Candice  Cruz reports that Dr Lear Ng instructed patient to take Xarelto today, ? ?I instructed Candice Cruz to shower with antibiotic soap, if it is available.  Dry off with a clean towel. Do not put lotion, powder, cologne or deodorant or makeup.No jewelry or piercings. Men may shave their face and neck. Woman should not shave. No nail polish, artificial or acrylic nails. Wear clean clothes, brush your teeth. ?Glasses, contact lens,dentures or partials may not be worn in the OR. If you need to wear them, please bring a case for glasses, do not wear contacts or bring a case, the hospital does not have contact cases, dentures or partials will have to be removed , make sure they are clean, we will provide a denture cup to put them in. You will need some one to drive you home and a responsible person over the age of 53 to stay with you for the first 24 hours after surgery.  ?

## 2021-06-30 ENCOUNTER — Ambulatory Visit (HOSPITAL_COMMUNITY)
Admission: RE | Admit: 2021-06-30 | Discharge: 2021-06-30 | Disposition: A | Discharge status: Home / Self Care | Payer: Managed Care, Other (non HMO) | Attending: Surgery | Admitting: Surgery

## 2021-06-30 ENCOUNTER — Ambulatory Visit (HOSPITAL_COMMUNITY): Payer: Managed Care, Other (non HMO)

## 2021-06-30 ENCOUNTER — Encounter (HOSPITAL_COMMUNITY)
Admission: RE | Disposition: A | Discharge status: Home / Self Care | Payer: Self-pay | Source: Home / Self Care | Attending: Surgery

## 2021-06-30 ENCOUNTER — Ambulatory Visit (HOSPITAL_BASED_OUTPATIENT_CLINIC_OR_DEPARTMENT_OTHER): Payer: Managed Care, Other (non HMO) | Admitting: Certified Registered Nurse Anesthetist

## 2021-06-30 ENCOUNTER — Other Ambulatory Visit: Payer: Self-pay

## 2021-06-30 ENCOUNTER — Ambulatory Visit (HOSPITAL_COMMUNITY): Payer: Managed Care, Other (non HMO) | Admitting: Certified Registered Nurse Anesthetist

## 2021-06-30 ENCOUNTER — Encounter (HOSPITAL_COMMUNITY): Payer: Self-pay | Admitting: Surgery

## 2021-06-30 DIAGNOSIS — I82B11 Acute embolism and thrombosis of right subclavian vein: Secondary | ICD-10-CM | POA: Insufficient documentation

## 2021-06-30 DIAGNOSIS — R21 Rash and other nonspecific skin eruption: Secondary | ICD-10-CM | POA: Insufficient documentation

## 2021-06-30 DIAGNOSIS — G543 Thoracic root disorders, not elsewhere classified: Secondary | ICD-10-CM | POA: Diagnosis not present

## 2021-06-30 DIAGNOSIS — Z7901 Long term (current) use of anticoagulants: Secondary | ICD-10-CM | POA: Diagnosis not present

## 2021-06-30 HISTORY — DX: Other complications of anesthesia, initial encounter: T88.59XA

## 2021-06-30 HISTORY — PX: VENOGRAM: SHX5497

## 2021-06-30 HISTORY — PX: ULTRASOUND GUIDANCE FOR VASCULAR ACCESS: SHX6516

## 2021-06-30 LAB — SURGICAL PCR SCREEN
MRSA, PCR: NEGATIVE
Staphylococcus aureus: NEGATIVE

## 2021-06-30 LAB — CBC
HCT: 43.2 % (ref 36.0–46.0)
Hemoglobin: 14.4 g/dL (ref 12.0–15.0)
MCH: 30 pg (ref 26.0–34.0)
MCHC: 33.3 g/dL (ref 30.0–36.0)
MCV: 90 fL (ref 80.0–100.0)
Platelets: 238 10*3/uL (ref 150–400)
RBC: 4.8 MIL/uL (ref 3.87–5.11)
RDW: 12.2 % (ref 11.5–15.5)
WBC: 5.8 10*3/uL (ref 4.0–10.5)
nRBC: 0 % (ref 0.0–0.2)

## 2021-06-30 LAB — POCT PREGNANCY, URINE: Preg Test, Ur: NEGATIVE

## 2021-06-30 LAB — POCT ACTIVATED CLOTTING TIME: Activated Clotting Time: 233 seconds

## 2021-06-30 SURGERY — VENOGRAM
Anesthesia: General | Site: Groin | Laterality: Right

## 2021-06-30 MED ORDER — GLYCOPYRROLATE 0.2 MG/ML IJ SOLN
INTRAMUSCULAR | Status: DC | PRN
  Administered 2021-06-30 (×2): .1 mg via INTRAVENOUS

## 2021-06-30 MED ORDER — SODIUM CHLORIDE 0.9 % IR SOLN: Status: DC | PRN

## 2021-06-30 MED ORDER — CHLORHEXIDINE GLUCONATE 4 % EX LIQD: 60.0000 mL | Freq: Once | CUTANEOUS | Status: DC

## 2021-06-30 MED ORDER — FENTANYL CITRATE (PF) 250 MCG/5ML IJ SOLN
INTRAMUSCULAR | Status: AC
  Filled 2021-06-30: qty 5

## 2021-06-30 MED ORDER — ORAL CARE MOUTH RINSE: 15.0000 mL | Freq: Once | OROMUCOSAL | Status: AC

## 2021-06-30 MED ORDER — FENTANYL CITRATE (PF) 250 MCG/5ML IJ SOLN
INTRAMUSCULAR | Status: DC | PRN
  Administered 2021-06-30: 50 ug via INTRAVENOUS
  Administered 2021-06-30 (×2): 25 ug via INTRAVENOUS
  Administered 2021-06-30: 50 ug via INTRAVENOUS
  Administered 2021-06-30: 25 ug via INTRAVENOUS
  Administered 2021-06-30: 50 ug via INTRAVENOUS
  Administered 2021-06-30: 25 ug via INTRAVENOUS

## 2021-06-30 MED ORDER — MIDAZOLAM HCL 2 MG/2ML IJ SOLN
INTRAMUSCULAR | Status: AC
  Filled 2021-06-30: qty 2

## 2021-06-30 MED ORDER — IODIXANOL 320 MG/ML IV SOLN
INTRAVENOUS | Status: DC | PRN
  Administered 2021-06-30: 75 mL via INTRAVENOUS

## 2021-06-30 MED ORDER — ROCURONIUM BROMIDE 10 MG/ML (PF) SYRINGE
PREFILLED_SYRINGE | INTRAVENOUS | Status: DC | PRN
  Administered 2021-06-30: 20 mg via INTRAVENOUS
  Administered 2021-06-30: 50 mg via INTRAVENOUS
  Administered 2021-06-30: 20 mg via INTRAVENOUS

## 2021-06-30 MED ORDER — MIDAZOLAM HCL 2 MG/2ML IJ SOLN
INTRAMUSCULAR | Status: DC | PRN
  Administered 2021-06-30: 2 mg via INTRAVENOUS

## 2021-06-30 MED ORDER — HEPARIN 6000 UNIT IRRIGATION SOLUTION
Status: DC | PRN
  Administered 2021-06-30: 1

## 2021-06-30 MED ORDER — PHENYLEPHRINE HCL (PRESSORS) 10 MG/ML IV SOLN
INTRAVENOUS | Status: AC
  Filled 2021-06-30: qty 1

## 2021-06-30 MED ORDER — ONDANSETRON HCL 4 MG/2ML IJ SOLN
INTRAMUSCULAR | Status: DC | PRN
  Administered 2021-06-30: 4 mg via INTRAVENOUS

## 2021-06-30 MED ORDER — SCOPOLAMINE 1 MG/3DAYS TD PT72
MEDICATED_PATCH | TRANSDERMAL | Status: DC | PRN
  Administered 2021-06-30: 1 via TRANSDERMAL

## 2021-06-30 MED ORDER — DEXAMETHASONE SODIUM PHOSPHATE 10 MG/ML IJ SOLN
INTRAMUSCULAR | Status: DC | PRN
Start: 2021-06-30 — End: 2021-06-30
  Administered 2021-06-30: 10 mg via INTRAVENOUS

## 2021-06-30 MED ORDER — PROCHLORPERAZINE EDISYLATE 10 MG/2ML IJ SOLN
10.0000 mg | Freq: Once | INTRAMUSCULAR | Status: AC | PRN
  Administered 2021-06-30: 10 mg via INTRAVENOUS

## 2021-06-30 MED ORDER — SUGAMMADEX SODIUM 200 MG/2ML IV SOLN
INTRAVENOUS | Status: DC | PRN
Start: 2021-06-30 — End: 2021-06-30
  Administered 2021-06-30: 200 mg via INTRAVENOUS

## 2021-06-30 MED ORDER — LACTATED RINGERS IV SOLN: INTRAVENOUS | Status: DC | PRN

## 2021-06-30 MED ORDER — PHENYLEPHRINE HCL (PRESSORS) 10 MG/ML IV SOLN
INTRAVENOUS | Status: DC | PRN
  Administered 2021-06-30: 80 ug via INTRAVENOUS

## 2021-06-30 MED ORDER — CEFAZOLIN SODIUM-DEXTROSE 2-4 GM/100ML-% IV SOLN
2.0000 g | INTRAVENOUS | Status: AC
  Administered 2021-06-30: 2 g via INTRAVENOUS
  Filled 2021-06-30: qty 100

## 2021-06-30 MED ORDER — FENTANYL CITRATE (PF) 100 MCG/2ML IJ SOLN: 25.0000 ug | INTRAMUSCULAR | Status: DC | PRN

## 2021-06-30 MED ORDER — ACETAMINOPHEN 500 MG PO TABS
1000.0000 mg | ORAL_TABLET | Freq: Once | ORAL | Status: AC
  Administered 2021-06-30: 1000 mg via ORAL
  Filled 2021-06-30: qty 2

## 2021-06-30 MED ORDER — SODIUM CHLORIDE 0.9 % IV SOLN: INTRAVENOUS | Status: DC

## 2021-06-30 MED ORDER — HEPARIN SODIUM (PORCINE) 1000 UNIT/ML IJ SOLN
INTRAMUSCULAR | Status: DC | PRN
  Administered 2021-06-30: 5000 [IU] via INTRAVENOUS
  Administered 2021-06-30: 1000 [IU] via INTRAVENOUS

## 2021-06-30 MED ORDER — PROCHLORPERAZINE EDISYLATE 10 MG/2ML IJ SOLN
INTRAMUSCULAR | Status: AC
  Filled 2021-06-30: qty 2

## 2021-06-30 MED ORDER — HEPARIN 6000 UNIT IRRIGATION SOLUTION
Status: AC
  Filled 2021-06-30: qty 500

## 2021-06-30 MED ORDER — CHLORHEXIDINE GLUCONATE 0.12 % MT SOLN
15.0000 mL | Freq: Once | OROMUCOSAL | Status: AC
  Administered 2021-06-30: 15 mL via OROMUCOSAL
  Filled 2021-06-30: qty 15

## 2021-06-30 MED ORDER — PROPOFOL 10 MG/ML IV BOLUS
INTRAVENOUS | Status: DC | PRN
  Administered 2021-06-30: 200 mg via INTRAVENOUS

## 2021-06-30 MED ORDER — PHENYLEPHRINE HCL-NACL 20-0.9 MG/250ML-% IV SOLN
INTRAVENOUS | Status: DC | PRN
  Administered 2021-06-30: 25 ug/min via INTRAVENOUS

## 2021-06-30 MED ORDER — 0.9 % SODIUM CHLORIDE (POUR BTL) OPTIME
TOPICAL | Status: DC | PRN
  Administered 2021-06-30: 1000 mL

## 2021-06-30 MED ORDER — MECLIZINE HCL 25 MG PO TABS
25.0000 mg | ORAL_TABLET | Freq: Once | ORAL | Status: DC | PRN
  Filled 2021-06-30: qty 1

## 2021-06-30 MED ORDER — DEXMEDETOMIDINE (PRECEDEX) IN NS 20 MCG/5ML (4 MCG/ML) IV SYRINGE
PREFILLED_SYRINGE | INTRAVENOUS | Status: DC | PRN
  Administered 2021-06-30 (×2): 8 ug via INTRAVENOUS

## 2021-06-30 MED ORDER — LIDOCAINE 2% (20 MG/ML) 5 ML SYRINGE
INTRAMUSCULAR | Status: DC | PRN
Start: 2021-06-30 — End: 2021-06-30
  Administered 2021-06-30: 60 mg via INTRAVENOUS

## 2021-06-30 MED ORDER — PROPOFOL 10 MG/ML IV BOLUS
INTRAVENOUS | Status: AC
  Filled 2021-06-30: qty 20

## 2021-06-30 SURGICAL SUPPLY — 55 items
BAG COUNTER SPONGE SURGICOUNT (BAG) ×4 IMPLANT
BALLN MUSTANG 10X80X75 (BALLOONS) ×4
BALLN MUSTANG 8X80X75 (BALLOONS) ×4
BALLN STERLING OTW 3X100X150 (BALLOONS) ×4
BALLOON MUSTANG 10X80X75 (BALLOONS) ×1 IMPLANT
BALLOON MUSTANG 8X80X75 (BALLOONS) ×1 IMPLANT
BALLOON STERLING OTW 3X100X150 (BALLOONS) ×1 IMPLANT
BNDG ELASTIC 4X5.8 VLCR STR LF (GAUZE/BANDAGES/DRESSINGS) ×2 IMPLANT
BNDG GAUZE ELAST 4 BULKY (GAUZE/BANDAGES/DRESSINGS) ×2 IMPLANT
CATH 0.018 NAVICROSS ANG 135 (CATHETERS) ×2 IMPLANT
CATH ANGIO 5F BER2 100CM (CATHETERS) ×2 IMPLANT
CATH ANGIO 5F BER2 65CM (CATHETERS) ×2 IMPLANT
CATH HEADHUNTER H1 5F 100CM (CATHETERS) ×2 IMPLANT
CATH NAVICROSS ST .035X135CM (MICROCATHETER) ×2 IMPLANT
CATH QUICKCROSS SUPP .035X90CM (MICROCATHETER) ×2 IMPLANT
CHLORAPREP W/TINT 26 (MISCELLANEOUS) ×4 IMPLANT
COVER MAYO STAND STRL (DRAPES) ×2 IMPLANT
COVER PROBE W GEL 5X96 (DRAPES) ×4 IMPLANT
COVER SURGICAL LIGHT HANDLE (MISCELLANEOUS) ×4 IMPLANT
DEVICE TORQUE H2O (MISCELLANEOUS) ×2 IMPLANT
DRAPE HALF SHEET 40X57 (DRAPES) ×2 IMPLANT
DRSG TEGADERM 2-3/8X2-3/4 SM (GAUZE/BANDAGES/DRESSINGS) ×2 IMPLANT
DRSG TEGADERM 4X4.5 CHG (GAUZE/BANDAGES/DRESSINGS) ×4 IMPLANT
GAUZE SPONGE 2X2 8PLY STRL LF (GAUZE/BANDAGES/DRESSINGS) ×2 IMPLANT
GAUZE SPONGE 4X4 12PLY STRL (GAUZE/BANDAGES/DRESSINGS) ×2 IMPLANT
GLIDEWIRE ADV .035X180CM (WIRE) ×2 IMPLANT
GLIDEWIRE ADV .035X260CM (WIRE) ×2 IMPLANT
GLOVE SRG 8 PF TXTR STRL LF DI (GLOVE) ×3 IMPLANT
GLOVE SURG POLYISO LF SZ7.5 (GLOVE) ×4 IMPLANT
GLOVE SURG UNDER POLY LF SZ8 (GLOVE) ×1
GOWN STRL REUS W/ TWL XL LVL3 (GOWN DISPOSABLE) ×4 IMPLANT
GOWN STRL REUS W/TWL XL LVL3 (GOWN DISPOSABLE) ×2
GUIDEWIRE ANGLED .035X150CM (WIRE) ×2 IMPLANT
KIT BASIN OR (CUSTOM PROCEDURE TRAY) ×4 IMPLANT
KIT ENCORE 26 ADVANTAGE (KITS) ×2 IMPLANT
NDL PERC 18GX7CM (NEEDLE) ×2 IMPLANT
NEEDLE PERC 18GX7CM (NEEDLE) ×4 IMPLANT
NS IRRIG 1000ML POUR BTL (IV SOLUTION) ×8 IMPLANT
PACK ENDO MINOR (CUSTOM PROCEDURE TRAY) ×2 IMPLANT
PAD ARMBOARD 7.5X6 YLW CONV (MISCELLANEOUS) ×8 IMPLANT
SET MICROPUNCTURE 5F STIFF (MISCELLANEOUS) ×4 IMPLANT
SHEATH PINNACLE 5F 10CM (SHEATH) ×2 IMPLANT
SHEATH PINNACLE 6F 10CM (SHEATH) ×2 IMPLANT
SNARE GOOSENECK 10MM (VASCULAR PRODUCTS) ×2 IMPLANT
SPONGE GAUZE 2X2 STER 10/PKG (GAUZE/BANDAGES/DRESSINGS) ×2
STOCKINETTE IMPERVIOUS LG (DRAPES) ×2 IMPLANT
STOPCOCK 4 WAY LG BORE MALE ST (IV SETS) ×6 IMPLANT
STOPCOCK MORSE 400PSI 3WAY (MISCELLANEOUS) ×4 IMPLANT
SYR 3ML LL SCALE MARK (SYRINGE) ×2 IMPLANT
TAPE CLOTH 4X10 WHT NS (GAUZE/BANDAGES/DRESSINGS) ×2 IMPLANT
TUBING CIL FLEX 10 FLL-RA (TUBING) ×4 IMPLANT
WATER STERILE IRR 1000ML POUR (IV SOLUTION) ×4 IMPLANT
WIRE BENTSON .035X145CM (WIRE) ×4 IMPLANT
WIRE G V18X300CM (WIRE) ×6 IMPLANT
WIRE TORQFLEX AUST .018X40CM (WIRE) ×4 IMPLANT

## 2021-06-30 NOTE — Op Note (Signed)
? ? ?Patient name: Candice Cruz MRN: [id number] DOB: 10-Feb-1996 Sex: female ? 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  #2: Ultrasound-guided access, right femoral vein ?  #3: Right arm and central venogram ?  #4: Balloon venoplasty of the right axillary, subclavian, and right innominate vein ?Anesthesia:  General ?Blood Loss: Minimal ?Specimens: None ? ?Findings: The patient had recanalized her venous system, however there were multiple collaterals and diffuse narrowing within the axillary subclavian and innominate veins with stenosis greater than 80%.  These were ballooned with an 8 and 10 mm balloon with inline flow reestablished with no residual stenosis. ? ?Indications: This is a 26 year old female with thoracic outlet syndrome.  She initially presented with central vein occlusion and arm swelling which was lysed and thrombectomized.  She then had resection of her right first rib.  During that procedure venogram showed that the central venous system had reoccluded.  I was unable to cross the occlusion.  She comes back in today for a repeat attempt. ? ?Procedure:  The patient was identified in the holding area and taken to [facility] OR ROOM 16  The patient was then placed supine on the table. general anesthesia was administered.  The patient was prepped and draped in the usual sterile fashion.  A time out was called and antibiotics were administered.  Ultrasound was used to evaluate the left basilic vein which was widely patent.  It was cannulated under ultrasound guidance with a micropuncture needle.  A 018 wire was advanced without resistance followed placement of a micropuncture sheath.  A venogram of the right arm and central venous system was performed that showed patency of the venous system  however there were numerous collaterals and significant greater than 80% stenosis within the axillary subclavian and innominate veins.  I then placed a 6 French sheath in the arm and tried to advance a wire across the occlusion.  I used a Glidewire advantage, Berenstein catheter, and V-18 wire, but was unable to cross the lesion.  I therefore elected to approach this from below. ? ?The right common femoral vein was evaluated with ultrasound and found be widely patent and easily compressible.  It was cannulated under ultrasound guidance with a micropuncture needle.  An 018 wire was advanced without resistance followed placement of micropuncture sheath.  Over a Bentson wire, a 5 French sheath was placed.  The patient was then fully heparinized.  I then used a Berenstein 2 catheter and a Glidewire to select the innominate vein.  I was then able to pass a Glidewire advantage with significant difficulty across the occlusion.  In order to get a stable platform, I snared the Glidewire from the arm and brought it out through the right arm sheath to have through and through access.  I then dilated the axillary subclavian and innominate veins with an 8 mm balloon.  Follow-up imaging revealed inline flow but residual narrowing.  I then upsized to a 10 x 80 Mustang balloon and repeated balloon venoplasty.  Completion venogram was then performed which showed full opacification of the central venous system.  I was satisfied with these results.  The wires were removed.  The sheaths were then removed and manual pressure was held for hemostasis.  The patient was then successfully extubated.  There were no immediate complications ? ? ?Disposition: To PACU stable ? ? ?  Juleen China, M.D., FACS ?[facility] ?Office: [phone] ?Pager:  [phone]  ?

## 2021-06-30 NOTE — Transfer of Care (Signed)
Immediate Anesthesia Transfer of Care Note ? ?Patient: Candice Cruz ? ?Procedure(s) Performed: VENOGRAM WITH VENOPLASTY (Right: Chest) ?ULTRASOUND GUIDANCE FOR VASCULAR ACCESS, RIGHT BRACHIAL VEIN AND RIGHT FEMORAL VEIN (Right: Groin) ? ?Patient Location: PACU ? ?Anesthesia Type:General ? ?Level of Consciousness: awake and patient cooperative ? ?Airway & Oxygen Therapy: Patient Spontanous Breathing and Patient connected to face mask oxygen ? ?Post-op Assessment: Report given to RN and Post -op Vital signs reviewed and stable ? ?Post vital signs: Reviewed and stable ? ?Last Vitals:  ?Vitals Value Taken Time  ?BP    ?Temp    ?Pulse    ?Resp    ?SpO2    ? ? ?Last Pain:  ?Vitals:  ? 06/30/21 0756  ?TempSrc: Oral  ?   ? ?  ? ?Complications: No notable events documented. ?

## 2021-06-30 NOTE — Anesthesia Procedure Notes (Signed)
Procedure Name: Intubation ?Date/Time: 06/30/2021 10:26 AM ?Performed by: Clearnce Sorrel, CRNA ?Pre-anesthesia Checklist: Patient identified, Emergency Drugs available, Suction available and Patient being monitored ?Patient Re-evaluated:Patient Re-evaluated prior to induction ?Oxygen Delivery Method: Circle System Utilized ?Preoxygenation: Pre-oxygenation with 100% oxygen ?Induction Type: IV induction ?Ventilation: Mask ventilation without difficulty ?Laryngoscope Size: Mac and 3 ?Grade View: Grade I ?Tube type: Oral ?Tube size: 7.0 mm ?Number of attempts: 1 ?Airway Equipment and Method: Stylet and Oral airway ?Placement Confirmation: ETT inserted through vocal cords under direct vision, positive ETCO2 and breath sounds checked- equal and bilateral ?Secured at: 21 cm ?Tube secured with: Tape ?Dental Injury: Teeth and Oropharynx as per pre-operative assessment  ? ? ? ? ?

## 2021-06-30 NOTE — Interval H&P Note (Signed)
History and Physical Interval Note: ? ?06/30/2021 ?9:11 AM ? ?[name]  has presented today for surgery, with the diagnosis of Occlusion of subclavian vein, Venous thoracic syndrome of subclavian vein.  The various methods of treatment have been discussed with the patient and family. After consideration of risks, benefits and other options for treatment, the patient has consented to  Procedure(s): ?VENOGRAM WITH VENOPLASTY (Right) ?INSERTION OF SUBCLAVIAN STENT (Right) as a surgical intervention.  The patient's history has been reviewed, patient examined, no change in status, stable for surgery.  I have reviewed the patient's chart and labs.  Questions were answered to the patient's satisfaction.   ? ? ?Candice Cruz ? ? ?

## 2021-06-30 NOTE — Discharge Instructions (Signed)
Patient may resume physical therapy without any restrictions ? ? ?Durene Cal, MD ?

## 2021-06-30 NOTE — Anesthesia Preprocedure Evaluation (Addendum)
Anesthesia Evaluation  ?Patient identified by MRN, date of birth, ID band ?Patient awake ? ? ? ?Reviewed: ?Allergy & Precautions, NPO status , Patient's Chart, lab work & pertinent test results ? ?History of Anesthesia Complications ?(+) PONV and history of anesthetic complications ? ?Airway ?Mallampati: II ? ?TM Distance: >3 FB ?Neck ROM: Full ? ? ? Dental ? ?(+) Dental Advisory Given, Teeth Intact ?  ?Pulmonary ?neg pulmonary ROS,  ?  ?Pulmonary exam normal ? ? ? ? ? ? ? Cardiovascular ?+ DVT  ?Normal cardiovascular exam ? ? ?  ?Neuro/Psych ?PSYCHIATRIC DISORDERS Anxiety Depression negative neurological ROS ?   ? GI/Hepatic ?negative GI ROS, Neg liver ROS,   ?Endo/Other  ?negative endocrine ROS ? Renal/GU ?negative Renal ROS  ? ?  ?Musculoskeletal ?negative musculoskeletal ROS ?(+)  ? Abdominal ?  ?Peds ? Hematology ? ?On xarelto ?   ?Anesthesia Other Findings ? ? Reproductive/Obstetrics ? ?  ? ? ? ? ? ? ? ? ? ? ? ? ? ?  ?  ? ? ? ? ? ? ? ?Anesthesia Physical ?Anesthesia Plan ? ?ASA: 2 ? ?Anesthesia Plan: General  ? ?Post-op Pain Management: Tylenol PO (pre-op)*  ? ?Induction: Intravenous ? ?PONV Risk Score and Plan: 4 or greater and Treatment may vary due to age or medical condition, Ondansetron, Dexamethasone, Midazolam and Scopolamine patch - Pre-op ? ?Airway Management Planned: Oral ETT ? ?Additional Equipment: None ? ?Intra-op Plan:  ? ?Post-operative Plan: Extubation in OR ? ?Informed Consent: I have reviewed the patients History and Physical, chart, labs and discussed the procedure including the risks, benefits and alternatives for the proposed anesthesia with the patient or authorized representative who has indicated his/her understanding and acceptance.  ? ? ? ?Dental advisory given ? ?Plan Discussed with: CRNA, Anesthesiologist and Surgeon ? ?Anesthesia Plan Comments:   ? ? ? ? ? ?Anesthesia Quick Evaluation ? ?

## 2021-07-03 ENCOUNTER — Encounter (HOSPITAL_COMMUNITY): Payer: Self-pay | Admitting: Surgery

## 2021-07-03 NOTE — Anesthesia Postprocedure Evaluation (Signed)
Anesthesia Post Note ? ?Patient: Rutha Melgoza ? ?Procedure(s) Performed: VENOGRAM WITH VENOPLASTY (Right: Chest) ?ULTRASOUND GUIDANCE FOR VASCULAR ACCESS, RIGHT BRACHIAL VEIN AND RIGHT FEMORAL VEIN (Right: Groin) ? ?  ? ?Patient location during evaluation: PACU ?Anesthesia Type: General ?Level of consciousness: awake and alert ?Pain management: pain level controlled ?Vital Signs Assessment: post-procedure vital signs reviewed and stable ?Respiratory status: spontaneous breathing, nonlabored ventilation and respiratory function stable ?Cardiovascular status: stable and blood pressure returned to baseline ?Anesthetic complications: no ? ? ?No notable events documented. ? ?Last Vitals:  ?Vitals:  ? 06/30/21 1350 06/30/21 1405  ?BP: 104/61 100/65  ?Pulse: 84 72  ?Resp: 17 14  ?Temp: 36.4 ?C   ?SpO2: 100% 100%  ?  ?Last Pain:  ?Vitals:  ? 06/30/21 1350  ?TempSrc:   ?PainSc: 0-No pain  ? ? ?  ?  ?  ?  ?  ?  ? ?Beryle Lathe ? ? ? ? ?

## 2021-07-11 ENCOUNTER — Other Ambulatory Visit: Payer: Self-pay

## 2021-07-11 ENCOUNTER — Ambulatory Visit: Payer: Commercial Managed Care - HMO

## 2021-07-11 DIAGNOSIS — M6281 Muscle weakness (generalized): Secondary | ICD-10-CM

## 2021-07-11 DIAGNOSIS — R293 Abnormal posture: Secondary | ICD-10-CM

## 2021-07-11 DIAGNOSIS — M25511 Pain in right shoulder: Secondary | ICD-10-CM | POA: Diagnosis not present

## 2021-07-11 NOTE — Therapy (Signed)
?OUTPATIENT PHYSICAL THERAPY TREATMENT NOTE/RE-EVALUATION ? ?Patient Name: Candice Cruz ?MRN: [id number] ?DOB:May 28, 1995, 28 y.o., female ?Today's Date: 07/11/2021 ? ?PCP: Farris Has, MD ?REFERRING PROVIDER: Nada Libman, MD ? ? PT End of Session - 07/11/21 1826   ? ? Visit Number 7   ? Number of Visits 17   ? Date for PT Re-Evaluation 08/05/21   ? Authorization Type [employer]   ? PT Start Time 1826   ? PT Stop Time 1910   ? PT Time Calculation (min) 44 min   ? Activity Tolerance Patient tolerated treatment well   ? Behavior During Therapy [facility] for tasks assessed/performed   ? ?  ?  ? ?  ? ? ? ? ? ? ? ?Past Medical History:  ?Diagnosis Date  ? Anxiety   ? situational  ? Complication of anesthesia   ? Depression   ? situational  ? DVT (deep venous thrombosis) (HCC) 05/08/2021  ? right axillary and subclavian DVTs 05/08/21, s/p thrombectomy, venoplasty 05/09/21  ? PONV (postoperative nausea and vomiting) 2023  ? "a little nauseous"  ? ?Past Surgical History:  ?Procedure Laterality Date  ? APPENDECTOMY    ? CARDIAC CATHETERIZATION    ? INTRAVASCULAR ULTRASOUND/IVUS Right 05/09/2021  ? Procedure: Intravascular Ultrasound/IVUS;  Surgeon: Nada Libman, MD;  Location: [facility] INVASIVE CV LAB;  Service: Cardiovascular;  Laterality: Right;  upper extremity  ? LAPAROSCOPIC APPENDECTOMY N/A 05/19/2016  ? Procedure: APPENDECTOMY LAPAROSCOPIC;  Surgeon: Harriette Bouillon, MD;  Location: [facility] OR;  Service: General;  Laterality: N/A;  ? PERIPHERAL VASCULAR BALLOON ANGIOPLASTY Right 05/09/2021  ? Procedure: PERIPHERAL VASCULAR BALLOON ANGIOPLASTY;  Surgeon: Nada Libman, MD;  Location: [facility] INVASIVE CV LAB;  Service: Cardiovascular;  Laterality: Right;  subclavian vein  ? PERIPHERAL VASCULAR THROMBECTOMY Right 05/09/2021  ? Procedure: PERIPHERAL VASCULAR THROMBECTOMY;  Surgeon: Nada Libman, MD;  Location: [facility] INVAS[facility] CV LAB;  Service: Cardiovascular;  Laterality: Right;  subclavian vein  ? RIB RESECTION Right 06/02/2021  ?  Procedure: RIGHT FIRST RIB RESECTION;  Surgeon: Nada Libman, MD;  Location: [facility] OR;  Service: Vascular;  Laterality: Right;  ? ULTRASOUND GUIDANCE FOR VASCULAR ACCESS Right 06/02/2021  ? Procedure: ULTRASOUND GUIDANCE FOR VASCULAR ACCESS;  Surgeon: Nada Libman, MD;  Location: [facility] OR;  Service: Vascular;  Laterality: Right;  ? ULTRASOUND GUIDANCE FOR VASCULAR ACCESS Right 06/30/2021  ? Procedure: ULTRASOUND GUIDANCE FOR VASCULAR ACCESS, RIGHT BRACHIAL VEIN AND RIGHT FEMORAL VEIN;  Surgeon: Nada Libman, MD;  Location: [facility] OR;  Service: Vascular;  Laterality: Right;  ? UPPER EXTREMITY VENOGRAPHY N/A 05/09/2021  ? Procedure: UPPER EXTREMITY VENOGRAPHY;  Surgeon: Nada Libman, MD;  Location: [facility] IN[facility];  Service: Cardiovascular;  Laterality: N/A;  ? VENOGRAM Right 06/02/2021  ? Procedure: VENOGRAM RIGHT UPPER EXTREMITY;  Surgeon: Nada Libman, MD;  Location: [facility] OR;  Service: Vascular;  Laterality: Right;  ? VENOGRAM Right 06/30/2021  ? Procedure: VENOGRAM WITH VENOPLASTY;  Surgeon: Nada Libman, MD;  Location: [facility] OR;  Service: Vascular;  Laterality: Right;  ? ?Patient Active Problem List  ? Diagnosis Date Noted  ? Venous thrombosis of upper extremity 06/02/2021  ? DVT (deep venous thrombosis) (HCC) 05/09/2021  ? Acute appendicitis 05/19/2016  ? ? ?REFERRING DIAG: R transaxillary first rib resection secondary to thoracic outlet syndrome. ? ?THERAPY DIAG:  ?Acute pain of right shoulder ? ?Muscle weakness (generalized) ? ?Abnormal posture ? ?PERTINENT HISTORY: RUE DVT 05/08/21 ? ?PRECAUTIONS: 10 lb lifting restriction for 6 weeks  following surgery on 06/02/21; MD clearance to resume PT without restrictions after recent surgical procedure on 06/30/21 uploaded into chart.   ? ?SUBJECTIVE:  ?Patient reports she is doing a lot better since her venogram with venoplasty on 06/30/21. She started work as a Scientist, physiologicalreceptionist a week ago and has noticed that she difficulty controlling her posture at work. She denies any  pain. She has f/u with Dr. Myra GianottiBrabham on 08/14/21.  ?PAIN:  ?Are you having pain? No ?NPRS scale: 0/10 ?Pain location: N/A ?PAIN TYPE: N/A ?Pain description: N/A ?Aggravating factors: N/A ?Relieving factors: N/A ?OBJECTIVE:  ?*Unless otherwise noted all objective measures were captured on initial evaluation.  ? ?DIAGNOSTIC FINDINGS:  ?CT angiography RUE: IMPRESSION: ?1. Apparent high origin of the radial artery from the axillary ?artery with somewhat diminutive appearing radial artery, this vessel ?is patent to the level of the mid forearm after which there is no ?significant flow enhancement which is probably related to contrast ?timing/inadequate opacification but patency of the distal vessel ?cannot be established on this exam. The remainder of the upper ?extremity vasculature from the axillary artery to the palmar arch on ?the ulnar side appears patent. ?2. Mild edema within the subcutaneous soft tissues of the upper ?extremity without focal fluid collection. ?  ?  ?PATIENT SURVEYS:  ?FOTO 52% to 73% ?06/28/21: 72% function  ?07/11/21: 70% function  ?  ?                              ?SENSATION: ?         Light touch: decreased sensation about posterior axilla ?          ?POSTURE: ?Forward head/rounded shoulders ?  ?PALPATION: ?Tautness and palpable tenderness Rt upper trap, levator scapulae, rhomboids ?  ?Cervical AROM: WNL all planes; mild ache with cervical extension and Rt side bend along right posterior neck ?  ?OBSERVATION:  incision site healing well with no signs of infection; no obvious swelling or discoloration about the RUE.  ? ?06/15/21: red, raised rash about axilla and upper arm; no discharge noted, incision site healing well with glue in place ?  ?UPPER EXTREMITY AROM/PROM: Left shoulder AROM WNL  ?  ?A/PROM Right ?06/07/2021 Left ?06/07/2021 06/15/21 Right 06/22/21 Rt  07/11/21 ?Right AROM  ?Shoulder flexion 83   160  180  ?Shoulder extension         ?Shoulder abduction 110    180 180  ?Shoulder adduction          ?Shoulder internal rotation 90     90  ?Shoulder external rotation 40     70  ?Elbow flexion         ?Elbow extension         ?Wrist flexion         ?Wrist extension         ?Wrist ulnar deviation         ?Wrist radial deviation         ?Wrist pronation         ?Wrist supination         ?(Blank rows = not tested) ?  ?UPPER EXTREMITY MMT:  ?  ?MMT Right ?07/11/21 Left ?07/11/21  ?Shoulder flexion  4-/5 4+/5   ?Shoulder extension      ?Shoulder abduction 5/5  5/5   ?Shoulder adduction      ?Shoulder internal rotation  4/5  5/5  ?Shoulder external rotation  4/5  5/5   ?Middle trapezius  4-/5 4-/5   ?Lower trapezius  3+/5 3+/5   ?Elbow flexion  5  5  ?Elbow extension  5 5   ?Wrist flexion      ?Wrist extension      ?Wrist ulnar deviation      ?Wrist radial deviation      ?Wrist pronation      ?Wrist supination      ?Grip strength (lbs) 45 45  ?(Blank rows = not tested) ?  ?  ?           ?TODAY'S TREATMENT:  ?[facility] Adult PT Treatment:                                                DATE: 07/11/21 ?Therapeutic Exercise: ?Resisted shoulder extension 2 x 15; red band ?Resisted rows green band 2 x 15 ?Resisted shoulder adduction 2 x 10 red band  ?Prone row 2 x 10; 2# ?Bicep curls red band 2 x 10 bilateral  ? ?Therapeutic Activity: ?Re-assessment to determine overall progress, educating patient on progress towards goals.  ? ? ? ?[facility] Adult PT Treatment:                                                DATE: 06/28/21 ?Therapeutic Exercise: ?Standing resisted shoulder extension 2 x 10; yellow band  ?Standing rows 2 x 10; red band  ?Resisted shoulder flexion 2 x 10; 2 lbs ?Resisted shoulder scaption 2 x 10; 2 lbs  ?Resisted shoulder abduction 2 x 10; 1 lb  ?Curl to overhead press 2 x 10; 1 lb  ?Prone T 2 x 10; 1 lb  ? ? ?[facility] Adult PT Treatment:                                                DATE: 06/24/2021 ?Therapeutic Exercise: ?Prone T 2 x 10  ?Prone W 2 x 10  ?Prone Y 2 x 10  ?Bilateral shoulder ER 2 x 10 yellow band  ?Sidelying ER 2  x 15 RUE  ?Prone opposite arm and leg extension x 10 BIL ?Chest press with dowel 2 x 10 ?Seated shoulder flexion with dowel 2 x 10 ?Seated bicep curl with dowel 2 x 10 ?Red PB roll up wall, lift off, 2 x 10 ? ?

## 2021-07-14 NOTE — Therapy (Signed)
?OUTPATIENT PHYSICAL THERAPY TREATMENT NOTE/RE-EVALUATION ? ?Patient Name: Candice BeetsLizbeth Lindsley ?MRN: [id number] ?DOB:05/20/1995, 26 y.o., female ?Today's Date: 07/15/2021 ? ?PCP: Farris HasMorrow, Aaron, MD ?REFERRING PROVIDER: Nada LibmanBrabham, Vance W, MD ? ? PT End of Session - 07/15/21 1114   ? ? Visit Number [id number]   ? Number of Visits 17   ? Date for PT Re-Evaluation 08/05/21   ? Authorization Type [employer]   ? PT Start Time 1114   ? PT Stop Time 1159   ? PT Time Calculation (min) 45 min   ? Activity Tolerance Patient tolerated treatment well   ? [facility] for [facility] assessed/performed   ? ?  ?  ? ?  ? ? ? ? ? ? ? ? ?Past Medical History:  ?Diagnosis Date  ? Anxiety   ? situational  ? Complication of anesthesia   ? Depression   ? situational  ? DVT (deep venous thrombosis) (HCC) 05/08/2021  ? right axillary and subclavian DVTs 05/08/21, s/p thrombectomy, venoplasty 05/09/21  ? PONV (postoperative nausea and vomiting) 2023  ? "a little nauseous"  ? ?Past Surgical History:  ?Procedure Laterality Date  ? APPENDECTOMY    ? CARDIAC CATHETERIZATION    ? INTRAVASCULAR ULTRASOUND/IVUS Right 05/09/2021  ? Procedure: Intravascular Ultrasound/IVUS;  Surgeon: Nada LibmanBrabham, Vance W, MD;  [facility];  Service: Cardiovascular;  Laterality: Right;  upper extremity  ? LAPAROSCOPIC APPENDECTOMY N/A 05/19/2016  ? Procedure: APPENDECTOMY LAPAROSCOPIC;  Surgeon: Harriette Bouillonhomas Cornett, MD;  [facility];  Service: General;  Laterality: N/A;  ? PERIPHERAL VASCULAR BALLOON ANGIOPLASTY Right 05/09/2021  ? Procedure: PERIPHERAL VASCULAR BALLOON ANGIOPLASTY;  Surgeon: Nada LibmanBrabham, Vance W, MD;  [facility]: [facility];  Service: Cardiovascular;  Laterality: Right;  subclavian vein  ? PERIPHERAL VASCULAR THROMBECTOMY Right 05/09/2021  ? Procedure: PERIPHERAL VASCULAR THROMBECTOMY;  Surgeon: Nada LibmanBrabham, Vance W, MD;  [facility]: [facility];  Service: Cardiovascular;  Laterality: Right;  subclavian vein  ? RIB RESECTION Right 06/02/2021  ?  Procedure: RIGHT FIRST RIB RESECTION;  Surgeon: Nada LibmanBrabham, Vance W, MD;  [facility];  Service: Vascular;  Laterality: Right;  ? ULTRASOUND GUIDANCE FOR VASCULAR ACCESS Right 06/02/2021  ? Procedure: ULTRASOUND GUIDANCE FOR VASCULAR ACCESS;  Surgeon: Nada LibmanBrabham, Vance W, MD;  [facility];  Service: Vascular;  Laterality: Right;  ? ULTRASOUND GUIDANCE FOR VASCULAR ACCESS Right 06/30/2021  ? Procedure: ULTRASOUND GUIDANCE FOR VASCULAR ACCESS, RIGHT BRACHIAL VEIN AND RIGHT FEMORAL VEIN;  Surgeon: Nada LibmanBrabham, Vance W, MD;  Location: [facility];  Service: Vascular;  Laterality: Right;  ? UPPER EXTREMITY VENOGRAPHY N/A 05/09/2021  ? Procedure: UPPER EXTREMITY VENOGRAPHY;  Surgeon: Nada LibmanBrabham, Vance W, MD;  [facility];  Service: Cardiovascular;  Laterality: N/A;  ? VENOGRAM Right 06/02/2021  ? Procedure: VENOGRAM RIGHT UPPER EXTREMITY;  Surgeon: Nada LibmanBrabham, Vance W, MD;  [facility];  Service: Vascular;  Laterality: Right;  ? VENOGRAM Right 06/30/2021  ? Procedure: VENOGRAM WITH VENOPLASTY;  Surgeon: Nada LibmanBrabham, Vance W, MD;  [facility];  Service: Vascular;  Laterality: Right;  ? ?Patient Active Problem List  ? Diagnosis Date Noted  ? Venous thrombosis of upper extremity 06/02/2021  ? DVT (deep venous thrombosis) (HCC) 05/09/2021  ? Acute appendicitis 05/19/2016  ? ? ?REFERRING DIAG: R transaxillary first rib resection secondary to thoracic outlet syndrome. ? ?THERAPY DIAG:  ?Acute pain of right shoulder ? ?Muscle weakness (generalized) ? ?Abnormal posture ? ?PERTINENT HISTORY: RUE DVT 05/08/21 ? ?PRECAUTIONS: 10 lb lifting restriction for 6  weeks following surgery on 06/02/21; MD clearance to resume PT without restrictions after recent surgical procedure on 06/30/21 uploaded into chart.   ? ?SUBJECTIVE: Patient reports she is feeling better overall and hasn't been having any pain lately. ? ?PAIN:  ?Are you having pain? No ?NPRS scale: 0/10 ?Pain location: N/A ?PAIN TYPE: N/A ?Pain description: N/A ?Aggravating factors:  N/A ?Relieving factors: N/A ?OBJECTIVE:  ?*Unless otherwise noted all objective measures were captured on initial evaluation.  ? ?DIAGNOSTIC FINDINGS:  ?CT angiography RUE: IMPRESSION: ?1. Apparent high origin of the radial artery from the axillary ?artery with somewhat diminutive appearing radial artery, this vessel ?is patent to the level of the mid forearm after which there is no ?significant flow enhancement which is probably related to contrast ?timing/inadequate opacification but patency of the distal vessel ?cannot be established on this exam. The remainder of the upper ?extremity vasculature from the axillary artery to the palmar arch on ?the ulnar side appears patent. ?2. Mild edema within the subcutaneous soft tissues of the upper ?extremity without focal fluid collection. ?  ?  ?PATIENT SURVEYS:  ?FOTO 52% to 73% ?06/28/21: 72% function  ?07/11/21: 70% function  ?  ?                              ?SENSATION: ?         Light touch: decreased sensation about posterior axilla ?          ?POSTURE: ?Forward head/rounded shoulders ?  ?PALPATION: ?Tautness and palpable tenderness Rt upper trap, levator scapulae, rhomboids ?  ?Cervical AROM: WNL all planes; mild ache with cervical extension and Rt side bend along right posterior neck ?  ?OBSERVATION:  incision site healing well with no signs of infection; no obvious swelling or discoloration about the RUE.  ? ?06/15/21: red, raised rash about axilla and upper arm; no discharge noted, incision site healing well with glue in place ?  ?UPPER EXTREMITY AROM/PROM: Left shoulder AROM WNL  ?  ?A/PROM Right ?06/07/2021 Left ?06/07/2021 06/15/21 Right 06/22/21 Rt  07/11/21 ?Right AROM  ?Shoulder flexion 83   160  180  ?Shoulder extension         ?Shoulder abduction 110    180 180  ?Shoulder adduction         ?Shoulder internal rotation 90     90  ?Shoulder external rotation 40     70  ?Elbow flexion         ?Elbow extension         ?Wrist flexion         ?Wrist extension          ?Wrist ulnar deviation         ?Wrist radial deviation         ?Wrist pronation         ?Wrist supination         ?(Blank rows = not tested) ?  ?UPPER EXTREMITY MMT:  ?  ?MMT Right ?07/11/21 Left ?07/11/21  ?Shoulder flexion  4-/5 4+/5   ?Shoulder extension      ?Shoulder abduction 5/5  5/5   ?Shoulder adduction      ?Shoulder internal rotation  4/5  5/5  ?Shoulder external rotation  4/5 5/5   ?Middle trapezius  4-/5 4-/5   ?Lower trapezius  3+/5 3+/5   ?Elbow flexion  5  5  ?Elbow extension  5 5   ?Wrist flexion      ?  Wrist extension      ?Wrist ulnar deviation      ?Wrist radial deviation      ?Wrist pronation      ?Wrist supination      ?Grip strength (lbs) 45 45  ?(Blank rows = not tested) ?  ?  ?           ?TODAY'S TREATMENT:  ?[facility] Adult PT Treatment:                                                DATE: 07/15/2021 ?Therapeutic Exercise: ?Resisted shoulder extension GTB 2 x 15 ?Resisted rows GTB 2 x 15 ?Palloff press 3# cable x10 BIL ?Prone row 2 x 10; 2# Rt ?Seated horizontal abduction RTB 2x10 ?Seated diagonals RTB x10 BIL ?Chin tucks into ball on wall 5" hold 2x10 ?Curl to overhead press 2 x 10; 1# ?Self Care: ?Office posture handout ? ? ?[facility] Adult PT Treatment:                                                DATE: 07/11/21 ?Therapeutic Exercise: ?Resisted shoulder extension 2 x 15; red band ?Resisted rows green band 2 x 15 ?Resisted shoulder adduction 2 x 10 red band  ?Prone row 2 x 10; 2# ?Bicep curls red band 2 x 10 bilateral  ? ?Therapeutic Activity: ?Re-assessment to determine overall progress, educating patient on progress towards goals.  ? ? ? ?[facility] Adult PT Treatment:                                                DATE: 06/28/21 ?Therapeutic Exercise: ?Standing resisted shoulder extension 2 x 10; yellow band  ?Standing rows 2 x 10; red band  ?Resisted shoulder flexion 2 x 10; 2 lbs ?Resisted shoulder scaption 2 x 10; 2 lbs  ?Resisted shoulder abduction 2 x 10; 1 lb  ?Curl to overhead press 2 x 10; 1 lb   ?Prone T 2 x 10; 1 lb  ? ? ?  ?PATIENT EDUCATION: ?Education details: see treatment  ?Person educated: Patient ?Education method: Explanation ?Education comprehension: verbalized understanding  ?  ?  ?HOME EXERCISE P

## 2021-07-15 ENCOUNTER — Ambulatory Visit: Payer: Commercial Managed Care - HMO

## 2021-07-15 ENCOUNTER — Other Ambulatory Visit: Payer: Self-pay

## 2021-07-15 DIAGNOSIS — M6281 Muscle weakness (generalized): Secondary | ICD-10-CM

## 2021-07-15 DIAGNOSIS — R293 Abnormal posture: Secondary | ICD-10-CM

## 2021-07-15 DIAGNOSIS — M25511 Pain in right shoulder: Secondary | ICD-10-CM

## 2021-07-18 ENCOUNTER — Ambulatory Visit: Payer: Commercial Managed Care - HMO

## 2021-07-18 ENCOUNTER — Other Ambulatory Visit: Payer: Self-pay

## 2021-07-18 DIAGNOSIS — M6281 Muscle weakness (generalized): Secondary | ICD-10-CM

## 2021-07-18 DIAGNOSIS — M25511 Pain in right shoulder: Secondary | ICD-10-CM | POA: Diagnosis not present

## 2021-07-18 DIAGNOSIS — R293 Abnormal posture: Secondary | ICD-10-CM

## 2021-07-18 NOTE — Therapy (Signed)
?OUTPATIENT PHYSICAL THERAPY TREATMENT NOTE/RE-EVALUATION ? ?Patient Name: Candice Cruz ?MRN: [id number] ?DOB:1996-04-29, 26 y.o., female ?Today's Date: 07/18/2021 ? ?PCP: Farris Has, MD ?REFERRING PROVIDER: Nada Libman, MD ? ? PT End of Session - 07/18/21 1830   ? ? Visit Number 9   ? Number of Visits 17   ? Date for PT Re-Evaluation 08/05/21   ? Authorization Type [employer]   ? PT Start Time 1830   ? PT Stop Time 1910   ? PT Time Calculation (min) 40 min   ? Activity Tolerance Patient tolerated treatment well   ? Behavior During Therapy [facility] for tasks assessed/performed   ? ?  ?  ? ?  ? ? ? ? ? ? ? ? ? ?Past Medical History:  ?Diagnosis Date  ? Anxiety   ? situational  ? Complication of anesthesia   ? Depression   ? situational  ? DVT (deep venous thrombosis) (HCC) 05/08/2021  ? right axillary and subclavian DVTs 05/08/21, s/p thrombectomy, venoplasty 05/09/21  ? PONV (postoperative nausea and vomiting) 2023  ? "a little nauseous"  ? ?Past Surgical History:  ?Procedure Laterality Date  ? APPENDECTOMY    ? CARDIAC CATHETERIZATION    ? INTRAVASCULAR ULTRASOUND/IVUS Right 05/09/2021  ? Procedure: Intravascular Ultrasound/IVUS;  Surgeon: Nada Libman, MD;  [facility]: [facility];  Service: Cardiovascular;  Laterality: Right;  upper extremity  ? LAPAROSCOPIC APPENDECTOMY N/A 05/19/2016  ? Procedure: APPENDECTOMY LAPAROSCOPIC;  Surgeon: Harriette Bouillon, MD;  [facility];  Service: General;  Laterality: N/A;  ? PERIPHERAL VASCULAR BALLOON ANGIOPLASTY Right 05/09/2021  ? Procedure: PERIPHERAL VASCULAR BALLOON ANGIOPLASTY;  Surgeon: Nada Libman, MD;  [facility]: [facility];  Service: Cardiovascular;  Laterality: Right;  subclavian vein  ? PERIPHERAL VASCULAR THROMBECTOMY Right 05/09/2021  ? Procedure: PERIPHERAL VASCULAR THROMBECTOMY;  Surgeon: Nada Libman, MD;  [facility]: [facility];  Service: Cardiovascular;  Laterality: Right;  subclavian vein  ? RIB RESECTION Right 06/02/2021  ?  Procedure: RIGHT FIRST RIB RESECTION;  Surgeon: Nada Libman, MD;  Location: [facility] OR;  Service: Vascular;  Laterality: Right;  ? ULTRASOUND GUIDANCE FOR VASCULAR ACCESS Right 06/02/2021  ? Procedure: ULTRASOUND GUIDANCE FOR VASCULAR ACCESS;  Surgeon: Nada Libman, MD;  Location[facility];  Service: Vascular;  Laterality: Right;  ? ULTRASOUND GUIDANCE FOR VASCULAR ACCESS Right 06/30/2021  ? Procedure: ULTRASOUND GUIDANCE FOR VASCULAR ACCESS, RIGHT BRACHIAL VEIN AND RIGHT FEMORAL VEIN;  Surgeon: Nada Libman, MD;  Location: [facility] OR;  Service: Vascular;  Laterality: Right;  ? UPPER EXTREMITY VENOGRAPHY N/A 05/09/2021  ? Procedure: UPPER EXTREMITY VENOGRAPHY;  Surgeon: Nada Libman, MD;  [facility]: [facility];  Service: Cardiovascular;  Laterality: N/A;  ? VENOGRAM Right 06/02/2021  ? Procedure: VENOGRAM RIGHT UPPER EXTREMITY;  Surgeon: Nada Libman, MD;  [facility];  Service: Vascular;  Laterality: Right;  ? VENOGRAM Right 06/30/2021  ? Procedure: VENOGRAM WITH VENOPLASTY;  Surgeon: Nada Libman, MD;  Location: [facility] OR;  Service: Vascular;  Laterality: Right;  ? ?Patient Active Problem List  ? Diagnosis Date Noted  ? Venous thrombosis of upper extremity 06/02/2021  ? DVT (deep venous thrombosis) (HCC) 05/09/2021  ? Acute appendicitis 05/19/2016  ? ? ?REFERRING DIAG: R transaxillary first rib resection secondary to thoracic outlet syndrome. ? ?THERAPY DIAG:  ?Acute pain of right shoulder ? ?Muscle weakness (generalized) ? ?Abnormal posture ? ?PERTINENT HISTORY: RUE DVT 05/08/21 ? ?PRECAUTIONS: 10 lb lifting restriction for  6 weeks following surgery on 06/02/21; MD clearance to resume PT without restrictions after recent surgical procedure on 06/30/21 uploaded into chart.   ? ?SUBJECTIVE: I was tired after our last session, but I didn't have any soreness. ? ?PAIN:  ?Are you having pain? No ?NPRS scale: 0/10 ?Pain location: N/A ?PAIN TYPE: N/A ?Pain description: N/A ?Aggravating factors: N/A ?Relieving  factors: N/A ?OBJECTIVE:  ?*Unless otherwise noted all objective measures were captured on initial evaluation.  ? ?DIAGNOSTIC FINDINGS:  ?CT angiography RUE: IMPRESSION: ?1. Apparent high origin of the radial artery from the axillary ?artery with somewhat diminutive appearing radial artery, this vessel ?is patent to the level of the mid forearm after which there is no ?significant flow enhancement which is probably related to contrast ?timing/inadequate opacification but patency of the distal vessel ?cannot be established on this exam. The remainder of the upper ?extremity vasculature from the axillary artery to the palmar arch on ?the ulnar side appears patent. ?2. Mild edema within the subcutaneous soft tissues of the upper ?extremity without focal fluid collection. ?  ?  ?PATIENT SURVEYS:  ?FOTO 52% to 73% ?06/28/21: 72% function  ?07/11/21: 70% function  ?  ?                              ?SENSATION: ?         Light touch: decreased sensation about posterior axilla ?          ?POSTURE: ?Forward head/rounded shoulders ?  ?PALPATION: ?Tautness and palpable tenderness Rt upper trap, levator scapulae, rhomboids ?  ?Cervical AROM: WNL all planes; mild ache with cervical extension and Rt side bend along right posterior neck ?  ?OBSERVATION:  incision site healing well with no signs of infection; no obvious swelling or discoloration about the RUE.  ? ?06/15/21: red, raised rash about axilla and upper arm; no discharge noted, incision site healing well with glue in place ?  ?UPPER EXTREMITY AROM/PROM: Left shoulder AROM WNL  ?  ?A/PROM Right ?06/07/2021 Left ?06/07/2021 06/15/21 Right 06/22/21 Rt  07/11/21 ?Right AROM  ?Shoulder flexion 83   160  180  ?Shoulder extension         ?Shoulder abduction 110    180 180  ?Shoulder adduction         ?Shoulder internal rotation 90     90  ?Shoulder external rotation 40     70  ?Elbow flexion         ?Elbow extension         ?Wrist flexion         ?Wrist extension         ?Wrist ulnar  deviation         ?Wrist radial deviation         ?Wrist pronation         ?Wrist supination         ?(Blank rows = not tested) ?  ?UPPER EXTREMITY MMT:  ?  ?MMT Right ?07/11/21 Left ?07/11/21  ?Shoulder flexion  4-/5 4+/5   ?Shoulder extension      ?Shoulder abduction 5/5  5/5   ?Shoulder adduction      ?Shoulder internal rotation  4/5  5/5  ?Shoulder external rotation  4/5 5/5   ?Middle trapezius  4-/5 4-/5   ?Lower trapezius  3+/5 3+/5   ?Elbow flexion  5  5  ?Elbow extension  5 5   ?Wrist flexion      ?  Wrist extension      ?Wrist ulnar deviation      ?Wrist radial deviation      ?Wrist pronation      ?Wrist supination      ?Grip strength (lbs) 45 45  ?(Blank rows = not tested) ?  ?  ?           ?TODAY'S TREATMENT:  ?[facility]:                                                DATE: 07/18/2021 ?Therapeutic Exercise: ?Resisted shoulder extension GTB 2 x 15 ?Resisted rows GTB 2 x 15 ?Palloff press 3# cable x10 BIL ?Chops 3# x10 BIL ?Reverse chops 3# x10 BIL ?Standing horizontal abduction GTB 2x10 ?Chin tucks into ball on wall 5" hold 2x10 ?Curl to overhead press 2 x 10; 2# ?Standing scaption 2# BIL x10 ? ? ?[facility]                                                DATE: 07/15/2021 ?Therapeutic Exercise: ?Resisted shoulder extension GTB 2 x 15 ?Resisted rows GTB 2 x 15 ?Palloff press 3# cable x10 BIL ?Prone row 2 x 10; 2# Rt ?Seated horizontal abduction RTB 2x10 ?Seated diagonals RTB x10 BIL ?Chin tucks into ball on wall 5" hold 2x10 ?Curl to overhead press 2 x 10; 1# ?Self Care: ?Office posture handout ? ? ?[facility] Adult PT Treatment:                                                DATE: 07/11/21 ?Therapeutic Exercise: ?Resisted shoulder extension 2 x 15; red band ?Resisted rows green band 2 x 15 ?Resisted shoulder adduction 2 x 10 red band  ?Prone row 2 x 10; 2# ?Bicep curls red band 2 x 10 bilateral  ? ?Therapeutic Activity: ?Re-assessment to determine overall progress, educating patient on  progress towards goals.  ? ? ?  ?PATIENT EDUCATION: ?Education details: see treatment  ?Person educated: Patient ?Education method: Explanation ?Education comprehension: verbalized understanding  ?  ?  ?HOME EXERCISE PROGRA

## 2021-07-19 NOTE — Therapy (Signed)
?OUTPATIENT PHYSICAL THERAPY TREATMENT NOTE ? ?Patient Name: Candice Cruz ?MRN: [id number] ?DOB:1996-01-06, 26 y.o., female ?Today's Date: 07/22/2021 ? ?PCP: Farris Has, MD ?REFERRING PROVIDER: Nada Libman, MD ? ? PT End of Session - 07/22/21 1108   ? ? Visit Number 10   ? Number of Visits 17   ? Date for PT Re-Evaluation 08/05/21   ? Authorization Type [employer]   ? PT Start Time 1115   ? PT Stop Time 1155   ? PT Time Calculation (min) 40 min   ? Activity Tolerance Patient tolerated treatment well   ? Behavior During Therapy [facility] for tasks assessed/performed   ? ?  ?  ? ?  ? ? ? ? ? ? ? ? ? ? ?Past Medical History:  ?Diagnosis Date  ? Anxiety   ? situational  ? Complication of anesthesia   ? Depression   ? situational  ? DVT (deep venous thrombosis) (HCC) 05/08/2021  ? right axillary and subclavian DVTs 05/08/21, s/p thrombectomy, venoplasty 05/09/21  ? PONV (postoperative nausea and vomiting) 2023  ? "a little nauseous"  ? ?Past Surgical History:  ?Procedure Laterality Date  ? APPENDECTOMY    ? CARDIAC CATHETERIZATION    ? INTRAVASCULAR ULTRASOUND/IVUS Right 05/09/2021  ? Procedure: Intravascular Ultrasound/IVUS;  Surgeon: Nada Libman, MD;  [facility]: [facility];  Service: Cardiovascular;  Laterality: Right;  upper extremity  ? LAPAROSCOPIC APPENDECTOMY N/A 05/19/2016  ? Procedure: APPENDECTOMY LAPAROSCOPIC;  Surgeon: Harriette Bouillon, MD;  Location: [facility] OR;  Service: General;  Laterality: N/A;  ? PERIPHERAL VASCULAR BALLOON ANGIOPLASTY Right 05/09/2021  ? Procedure: PERIPHERAL VASCULAR BALLOON ANGIOPLASTY;  Surgeon: Nada Libman, MD;  [facility]: [facility];  Service: Cardiovascular;  Laterality: Right;  subclavian vein  ? PERIPHERAL VASCULAR THROMBECTOMY Right 05/09/2021  ? Procedure: PERIPHERAL VASCULAR THROMBECTOMY;  Surgeon: Nada Libman, MD;  [facility]: [facility];  Service: Cardiovascular;  Laterality: Right;  subclavian vein  ? RIB RESECTION Right 06/02/2021  ? Procedure: RIGHT  FIRST RIB RESECTION;  Surgeon: Nada Libman, MD;  Location: [facility] OR;  Service: Vascular;  Laterality: Right;  ? ULTRASOUND GUIDANCE FOR VASCULAR ACCESS Right 06/02/2021  ? Procedure: ULTRASOUND GUIDANCE FOR VASCULAR ACCESS;  Surgeon: Nada Libman, MD;  Location: [facility] OR;  Service: Vascular;  Laterality: Right;  ? ULTRASOUND GUIDANCE FOR VASCULAR ACCESS Right 06/30/2021  ? Procedure: ULTRASOUND GUIDANCE FOR VASCULAR ACCESS, RIGHT BRACHIAL VEIN AND RIGHT FEMORAL VEIN;  Surgeon: Nada Libman, MD;  Location: [facility] OR;  Service: Vascular;  Laterality: Right;  ? UPPER EXTREMITY VENOGRAPHY N/A 05/09/2021  ? Procedure: UPPER EXTREMITY VENOGRAPHY;  Surgeon: Nada Libman, MD;  [facility];  Service: Cardiovascular;  Laterality: N/A;  ? VENOGRAM Right 06/02/2021  ? Procedure: VENOGRAM RIGHT UPPER EXTREMITY;  Surgeon: Nada Libman, MD;  Location: [facility] OR;  Service: Vascular;  Laterality: Right;  ? VENOGRAM Right 06/30/2021  ? Procedure: VENOGRAM WITH VENOPLASTY;  Surgeon: Nada Libman, MD;  Location: [facility] OR;  Service: Vascular;  Laterality: Right;  ? ?Patient Active Problem List  ? Diagnosis Date Noted  ? Venous thrombosis of upper extremity 06/02/2021  ? DVT (deep venous thrombosis) (HCC) 05/09/2021  ? Acute appendicitis 05/19/2016  ? ? ?REFERRING DIAG: R transaxillary first rib resection secondary to thoracic outlet syndrome. ? ?THERAPY DIAG:  ?Acute pain of right shoulder ? ?Muscle weakness (generalized) ? ?Abnormal posture ? ?PERTINENT HISTORY: RUE DVT 05/08/21 ? ?PRECAUTIONS: None ? ?SUBJECTIVE: Patient  reports soreness following last visit, denies any pain. Exercises are going well. ? ?PAIN:  ?Are you having pain? No ?NPRS scale: 0/10 ?Pain location: N/A ?PAIN TYPE: N/A ?Pain description: N/A ?Aggravating factors: N/A ?Relieving factors: N/A ? ? ?OBJECTIVE:  ?*Unless otherwise noted all objective measures were captured on initial evaluation.  ?PATIENT SURVEYS:  ?FOTO 52% to 73% ?06/28/21: 72% function   ?07/11/21: 70% function  ?                 ?SENSATION: ?         Light touch: decreased sensation about posterior axilla ?          ?POSTURE: ?Forward head/rounded shoulders ?  ?PALPATION: ?Tautness and palpable tenderness Rt upper trap, levator scapulae, rhomboids ?  ?Cervical AROM:  ?WNL all planes; mild ache with cervical extension and Rt side bend along right posterior neck ?  ?UPPER EXTREMITY AROM/PROM: Left shoulder AROM WNL  ?  ?A/PROM Right ?06/07/2021 Left ?06/07/2021 06/15/21 Right 06/22/21 Rt  07/11/21 ?Right AROM  ?Shoulder flexion 83   160  180  ?Shoulder extension         ?Shoulder abduction 110    180 180  ?Shoulder adduction         ?Shoulder internal rotation 90     90  ?Shoulder external rotation 40     70  ?Elbow flexion         ?Elbow extension         ?Wrist flexion         ?Wrist extension         ?Wrist ulnar deviation         ?Wrist radial deviation         ?Wrist pronation         ?Wrist supination         ? ?UPPER EXTREMITY MMT:  ?  ?MMT Right ?07/11/21 Left ?07/11/21 Rt / Lt ?07/22/2021  ?Shoulder flexion  4-/5 4+/5    ?Shoulder extension       ?Shoulder abduction 5/5  5/5    ?Shoulder adduction       ?Shoulder internal rotation  4/5  5/5   ?Shoulder external rotation  4/5 5/5    ?Middle trapezius  4-/5 4-/5  4- / 4-  ?Lower trapezius  3+/5 3+/5  4- / 4-  ?Elbow flexion  5  5   ?Elbow extension  5 5    ?Wrist flexion       ?Wrist extension       ?Wrist ulnar deviation       ?Wrist radial deviation       ?Wrist pronation       ?Wrist supination       ?Grip strength (lbs) 45 45   ? ?  ?TODAY'S TREATMENT:  ?[facility] Adult PT Treatment:                                                DATE: 07/22/2021 ?Therapeutic Exercise: ?UBE L1 x 4 min (2 fwd/bwd) ?Shoulder extension GTB 2 x 15 ?Row GTB 2 x 15 ?Supine scapular protraction/retraction on FR 2 x 10 ?Supine horizontal abduction with green on FR 2 x 10 ?Supine diagonals with green on FR 2 x 10 ?Cat cow x 10 ?Quadruped chin tuck x 10 ?Quadruped thoracic rotation  with hand behind  neck x 5 each ?Palloff press 7# cable 2 x 10 each ?Scaption with 2# 2 x 10 ?High plank on elevated table with alternating shoulder taps 2 x 10 ?Seated double ER and scap retraction with green 2 x 15 ? ? ?[facility]                                                DATE: 07/18/2021 ?Therapeutic Exercise: ?Resisted shoulder extension GTB 2 x 15 ?Resisted rows GTB 2 x 15 ?Palloff press 3# cable x10 BIL ?Chops 3# x10 BIL ?Reverse chops 3# x10 BIL ?Standing horizontal abduction GTB 2x10 ?Chin tucks into ball on wall 5" hold 2x10 ?Curl to overhead press 2 x 10; 2# ?Standing scaption 2# BIL x10 ? ?[facility]                                                DATE: 07/15/2021 ?Therapeutic Exercise: ?Resisted shoulder extension GTB 2 x 15 ?Resisted rows GTB 2 x 15 ?Palloff press 3# cable x10 BIL ?Prone row 2 x 10; 2# Rt ?Seated horizontal abduction RTB 2x10 ?Seated diagonals RTB x10 BIL ?Chin tucks into ball on wall 5" hold 2x10 ?Curl to overhead press 2 x 10; 1# ?Self Care: ?Office posture handout ?  ?PATIENT EDUCATION: ?Education details: HEP ?Person educated: Patient ?Education method: Explanation ?Education comprehension: verbalized understanding  ?  ?HOME EXERCISE PROGRAM: ?Access Code: [email or web address] ?  ?  ?ASSESSMENT: ?CLINICAL IMPRESSION: ?Patient tolerated therapy well with no adverse effects. Therapy continues to focus on postural strengthening and control. She did note fatigue at end of session but denies any onset of pain. She seems to be improving with therapy but does continue to exhibit gross strength deficits. No changes made to HEP this visit. Patient would benefit from continued skilled PT to progress strength in order to maximize her functional ability. ?  ?  ?GOALS: ?Goals reviewed with patient? No ?  ?SHORT TERM GOALS: ?  ?STG Name Target Date Goal status  ?1 Patient will be independent and compliant with initial HEP.  ?  ?Baseline:  06/28/2021 Achieved   ?2 Patient will  tolerate lying flat in supine to improve her overall sleep quality.  ?Baseline: must utilize wedge due to pain. 07/05/2021 Achieved   ?3 Patient will demonstrate at least 120 degrees of Rt shoulder flexion

## 2021-07-22 ENCOUNTER — Ambulatory Visit: Payer: Commercial Managed Care - HMO | Admitting: Physical Therapy

## 2021-07-22 ENCOUNTER — Other Ambulatory Visit: Payer: Self-pay

## 2021-07-22 ENCOUNTER — Encounter: Payer: Self-pay | Admitting: Physical Therapy

## 2021-07-22 DIAGNOSIS — M6281 Muscle weakness (generalized): Secondary | ICD-10-CM

## 2021-07-22 DIAGNOSIS — M25511 Pain in right shoulder: Secondary | ICD-10-CM

## 2021-07-22 DIAGNOSIS — R293 Abnormal posture: Secondary | ICD-10-CM

## 2021-07-25 ENCOUNTER — Ambulatory Visit: Payer: Commercial Managed Care - HMO

## 2021-07-25 ENCOUNTER — Other Ambulatory Visit: Payer: Self-pay

## 2021-07-25 DIAGNOSIS — R293 Abnormal posture: Secondary | ICD-10-CM

## 2021-07-25 DIAGNOSIS — M25511 Pain in right shoulder: Secondary | ICD-10-CM | POA: Diagnosis not present

## 2021-07-25 DIAGNOSIS — M6281 Muscle weakness (generalized): Secondary | ICD-10-CM

## 2021-07-25 NOTE — Therapy (Signed)
?OUTPATIENT PHYSICAL THERAPY TREATMENT NOTE ? ?Patient Name: Candice Cruz ?MRN: [id number] ?DOB:05/12/1995, 26 y.o., female ?Today's Date: 07/25/2021 ? ?PCP: Farris HasMorrow, Aaron, MD ?REFERRING PROVIDER: Nada LibmanBrabham, Vance W, MD ? ? PT End of Session - 07/25/21 1831   ? ? Visit Number 11   ? Number of Visits 17   ? Date for PT Re-Evaluation 08/05/21   ? Authorization Type [employer]   ? PT Start Time 1830   ? PT Stop Time 1910   ? PT Time Calculation (min) 40 min   ? Activity Tolerance Patient tolerated treatment well   ? Behavior [facility] for tasks assessed/performed   ? ?  ?  ? ?  ? ? ? ? ? ? ? ? ? ? ? ?Past Medical History:  ?Diagnosis Date  ? Anxiety   ? situational  ? Complication of anesthesia   ? Depression   ? situational  ? DVT (deep venous thrombosis) (HCC) 05/08/2021  ? right axillary and subclavian DVTs 05/08/21, s/p thrombectomy, venoplasty 05/09/21  ? PONV (postoperative nausea and vomiting) 2023  ? "a little nauseous"  ? ?Past Surgical History:  ?Procedure Laterality Date  ? APPENDECTOMY    ? CARDIAC CATHETERIZATION    ? INTRAVASCULAR ULTRASOUND/IVUS Right 05/09/2021  ? Procedure: Intravascular Ultrasound/IVUS;  Surgeon: Nada LibmanBrabham, Vance W, MD;  [facility]: [facility];  Service: Cardiovascular;  Laterality: Right;  upper extremity  ? LAPAROSCOPIC APPENDECTOMY N/A 05/19/2016  ? Procedure: APPENDECTOMY LAPAROSCOPIC;  Surgeon: Harriette Bouillonhomas Cornett, MD;  [facility];  Service: General;  Laterality: N/A;  ? PERIPHERAL VASCULAR BALLOON ANGIOPLASTY Right 05/09/2021  ? Procedure: PERIPHERAL VASCULAR BALLOON ANGIOPLASTY;  Surgeon: Nada LibmanBrabham, Vance W, MD;  [facility]: [facility];  Service: Cardiovascular;  Laterality: Right;  subclavian vein  ? PERIPHERAL VASCULAR THROMBECTOMY Right 05/09/2021  ? Procedure: PERIPHERAL VASCULAR THROMBECTOMY;  Surgeon: Nada LibmanBrabham, Vance W, MD;  [facility]: [facility];  Service: Cardiovascular;  Laterality: Right;  subclavian vein  ? RIB RESECTION Right 06/02/2021  ? Procedure:  RIGHT FIRST RIB RESECTION;  Surgeon: Nada LibmanBrabham, Vance W, MD;  [facility];  Service: Vascular;  Laterality: Right;  ? ULTRASOUND GUIDANCE FOR VASCULAR ACCESS Right 06/02/2021  ? Procedure: ULTRASOUND GUIDANCE FOR VASCULAR ACCESS;  Surgeon: Nada LibmanBrabham, Vance W, MD;  [facility];  Service: Vascular;  Laterality: Right;  ? ULTRASOUND GUIDANCE FOR VASCULAR ACCESS Right 06/30/2021  ? Procedure: ULTRASOUND GUIDANCE FOR VASCULAR ACCESS, RIGHT BRACHIAL VEIN AND RIGHT FEMORAL VEIN;  Surgeon: Nada LibmanBrabham, Vance W, MD;  Location: [facility] OR;  Service: Vascular;  Laterality: Right;  ? UPPER EXTREMITY VENOGRAPHY N/A 05/09/2021  ? Procedure: UPPER EXTREMITY VENOGRAPHY;  Surgeon: Nada LibmanBrabham, Vance W, MD;  [facility];  Service: Cardiovascular;  Laterality: N/A;  ? VENOGRAM Right 06/02/2021  ? Procedure: VENOGRAM RIGHT UPPER EXTREMITY;  Surgeon: Nada LibmanBrabham, Vance W, MD;  [facility];  Service: Vascular;  Laterality: Right;  ? VENOGRAM Right 06/30/2021  ? Procedure: VENOGRAM WITH VENOPLASTY;  Surgeon: Nada LibmanBrabham, Vance W, MD;  [facility];  Service: Vascular;  Laterality: Right;  ? ?Patient Active Problem List  ? Diagnosis Date Noted  ? Venous thrombosis of upper extremity 06/02/2021  ? DVT (deep venous thrombosis) (HCC) 05/09/2021  ? Acute appendicitis 05/19/2016  ? ? ?REFERRING DIAG: R transaxillary first rib resection secondary to thoracic outlet syndrome. ? ?THERAPY DIAG:  ?Acute pain of right shoulder ? ?Muscle weakness (generalized) ? ?Abnormal posture ? ?PERTINENT HISTORY: RUE DVT 05/08/21 ? ?PRECAUTIONS: None ? ?SUBJECTIVE:  Patient reports soreness following last visit, denies any pain. Exercises are going well. ? ?PAIN:  ?Are you having pain? No ?NPRS scale: 0/10 ?Pain location: N/A ?PAIN TYPE: N/A ?Pain description: N/A ?Aggravating factors: N/A ?Relieving factors: N/A ? ? ?OBJECTIVE:  ?*Unless otherwise noted all objective measures were captured on initial evaluation.  ?PATIENT SURVEYS:  ?FOTO 52% to 73% ?06/28/21: 72%  function  ?07/11/21: 70% function  ?                 ?SENSATION: ?         Light touch: decreased sensation about posterior axilla ?          ?POSTURE: ?Forward head/rounded shoulders ?  ?PALPATION: ?Tautness and palpable tenderness Rt upper trap, levator scapulae, rhomboids ?  ?Cervical AROM:  ?WNL all planes; mild ache with cervical extension and Rt side bend along right posterior neck ?  ?UPPER EXTREMITY AROM/PROM: Left shoulder AROM WNL  ?  ?A/PROM Right ?06/07/2021 Left ?06/07/2021 06/15/21 Right 06/22/21 Rt  07/11/21 ?Right AROM  ?Shoulder flexion 83   160  180  ?Shoulder extension         ?Shoulder abduction 110    180 180  ?Shoulder adduction         ?Shoulder internal rotation 90     90  ?Shoulder external rotation 40     70  ?Elbow flexion         ?Elbow extension         ?Wrist flexion         ?Wrist extension         ?Wrist ulnar deviation         ?Wrist radial deviation         ?Wrist pronation         ?Wrist supination         ? ?UPPER EXTREMITY MMT:  ?  ?MMT Right ?07/11/21 Left ?07/11/21 Rt / Lt ?07/22/2021  ?Shoulder flexion  4-/5 4+/5    ?Shoulder extension       ?Shoulder abduction 5/5  5/5    ?Shoulder adduction       ?Shoulder internal rotation  4/5  5/5   ?Shoulder external rotation  4/5 5/5    ?Middle trapezius  4-/5 4-/5  4- / 4-  ?Lower trapezius  3+/5 3+/5  4- / 4-  ?Elbow flexion  5  5   ?Elbow extension  5 5    ?Wrist flexion       ?Wrist extension       ?Wrist ulnar deviation       ?Wrist radial deviation       ?Wrist pronation       ?Wrist supination       ?Grip strength (lbs) 45 45   ? ?  ?TODAY'S TREATMENT:  ?[facility] Adult PT Treatment:                                                DATE: 07/25/2021 ?Therapeutic Exercise: ?UBE L1 x 5 min (2.5 fwd/bwd) ?Shoulder extension GTB 2 x 15 ?Row GTB 2 x 15 ?Supine scapular protraction/retraction on FR 2 x 10 ?Supine horizontal abduction with green on FR 2 x 10 ?Supine diagonals with green on FR 2 x 10 ?Palloff press 7# cable 2 x 10 each ?Manual Therapy: ?STM  to bilateral upper  traps to decrease tightness ? ? ? ?[facility] Adult PT Treatment:                                                DATE: 07/22/2021 ?Therapeutic Exercise: ?UBE L1 x 4 min (2 fwd/bwd) ?Shoulder extension GTB 2 x 15 ?Row GTB 2 x 15 ?Supine scapular protraction/retraction on FR 2 x 10 ?Supine horizontal abduction with green on FR 2 x 10 ?Supine diagonals with green on FR 2 x 10 ?Cat cow x 10 ?Quadruped chin tuck x 10 ?Quadruped thoracic rotation with hand behind neck x 5 each ?Palloff press 7# cable 2 x 10 each ?Scaption with 2# 2 x 10 ?High plank on elevated table with alternating shoulder taps 2 x 10 ?Seated double ER and scap retraction with green 2 x 15 ? ? ?[facility] Adult PT Treatment:                                                DATE: 07/18/2021 ?Therapeutic Exercise: ?Resisted shoulder extension GTB 2 x 15 ?Resisted rows GTB 2 x 15 ?Palloff press 3# cable x10 BIL ?Chops 3# x10 BIL ?Reverse chops 3# x10 BIL ?Standing horizontal abduction GTB 2x10 ?Chin tucks into ball on wall 5" hold 2x10 ?Curl to overhead press 2 x 10; 2# ?Standing scaption 2# BIL x10 ? ?  ?PATIENT EDUCATION: ?Education details: HEP ?Person educated: Patient ?Education method: Explanation ?Education comprehension: verbalized understanding  ?  ?HOME EXERCISE PROGRAM: ?Access Code: [email or web address] ?  ?  ?ASSESSMENT: ?CLINICAL IMPRESSION: ?Patient presents to therapy with no pain but tiredness and soreness form last session. Session today focused on postural strengthening and control. Patient was able to tolerate all prescribed exercises with no adverse effects. Patient continues to benefit from skilled PT services and should be progressed as able to improve functional independence. ? ?  ?  ?GOALS: ?Goals reviewed with patient? No ?  ?SHORT TERM GOALS: ?  ?STG Name Target Date Goal status  ?1 Patient will be independent and compliant with initial HEP.  ?  ?Baseline:  06/28/2021 Achieved   ?2 Patient will tolerate lying flat in supine to improve her  overall sleep quality.  ?Baseline: must utilize wedge due to pain. 07/05/2021 Achieved   ?3 Patient will demonstrate at least 120 degrees of Rt shoulder flexion AROM to improve ability to reach into cabinet.

## 2021-07-28 ENCOUNTER — Emergency Department (HOSPITAL_BASED_OUTPATIENT_CLINIC_OR_DEPARTMENT_OTHER)
Admission: EM | Admit: 2021-07-28 | Discharge: 2021-07-28 | Disposition: A | Discharge status: Home / Self Care | Payer: Commercial Managed Care - HMO | Attending: Emergency Medicine | Admitting: Emergency Medicine

## 2021-07-28 ENCOUNTER — Encounter (HOSPITAL_BASED_OUTPATIENT_CLINIC_OR_DEPARTMENT_OTHER): Payer: Self-pay

## 2021-07-28 ENCOUNTER — Other Ambulatory Visit: Payer: Self-pay

## 2021-07-28 DIAGNOSIS — R197 Diarrhea, unspecified: Secondary | ICD-10-CM

## 2021-07-28 DIAGNOSIS — R110 Nausea: Secondary | ICD-10-CM | POA: Insufficient documentation

## 2021-07-28 DIAGNOSIS — Z7901 Long term (current) use of anticoagulants: Secondary | ICD-10-CM | POA: Diagnosis not present

## 2021-07-28 LAB — PREGNANCY, URINE: Preg Test, Ur: NEGATIVE

## 2021-07-28 LAB — CBC WITH DIFFERENTIAL/PLATELET
Abs Immature Granulocytes: 0.07 10*3/uL (ref 0.00–0.07)
Basophils Absolute: 0 10*3/uL (ref 0.0–0.1)
Basophils Relative: 0 %
Eosinophils Absolute: 0 10*3/uL (ref 0.0–0.5)
Eosinophils Relative: 0 %
HCT: 45.6 % (ref 36.0–46.0)
Hemoglobin: 15.2 g/dL — ABNORMAL HIGH (ref 12.0–15.0)
Immature Granulocytes: 0 %
Lymphocytes Relative: 2 %
Lymphs Abs: 0.3 10*3/uL — ABNORMAL LOW (ref 0.7–4.0)
MCH: 29.5 pg (ref 26.0–34.0)
MCHC: 33.3 g/dL (ref 30.0–36.0)
MCV: 88.4 fL (ref 80.0–100.0)
Monocytes Absolute: 0.3 10*3/uL (ref 0.1–1.0)
Monocytes Relative: 2 %
Neutro Abs: 15 10*3/uL — ABNORMAL HIGH (ref 1.7–7.7)
Neutrophils Relative %: 96 %
Platelets: 265 10*3/uL (ref 150–400)
RBC: 5.16 MIL/uL — ABNORMAL HIGH (ref 3.87–5.11)
RDW: 12.5 % (ref 11.5–15.5)
WBC: 15.6 10*3/uL — ABNORMAL HIGH (ref 4.0–10.5)
nRBC: 0 % (ref 0.0–0.2)

## 2021-07-28 LAB — BASIC METABOLIC PANEL
Anion gap: 14 (ref 5–15)
BUN: 13 mg/dL (ref 6–20)
CO2: 21 mmol/L — ABNORMAL LOW (ref 22–32)
Calcium: 10.2 mg/dL (ref 8.9–10.3)
Chloride: 101 mmol/L (ref 98–111)
Creatinine, Ser: 0.82 mg/dL (ref 0.44–1.00)
GFR, Estimated: >60 mL/min (ref >60)
Glucose, Bld: 122 mg/dL — ABNORMAL HIGH (ref 70–99)
Potassium: 4.2 mmol/L (ref 3.5–5.1)
Sodium: 136 mmol/L (ref 135–145)

## 2021-07-28 LAB — URINALYSIS, ROUTINE W REFLEX MICROSCOPIC
Bilirubin Urine: NEGATIVE
Glucose, UA: NEGATIVE mg/dL
Hgb urine dipstick: NEGATIVE
Ketones, ur: >80 mg/dL — AB
Leukocytes,Ua: NEGATIVE
Nitrite: NEGATIVE
Protein, ur: 30 mg/dL — AB
Specific Gravity, Urine: 1.024 (ref 1.005–1.030)
pH: 5.5 (ref 5.0–8.0)

## 2021-07-28 LAB — MAGNESIUM: Magnesium: 2 mg/dL (ref 1.7–2.4)

## 2021-07-28 MED ORDER — LOPERAMIDE HCL 2 MG PO CAPS
4.0000 mg | ORAL_CAPSULE | Freq: Once | ORAL | Status: AC
  Administered 2021-07-28: 4 mg via ORAL
  Filled 2021-07-28: qty 2

## 2021-07-28 MED ORDER — SODIUM CHLORIDE 0.9 % IV BOLUS: 1000.0000 mL | Freq: Once | INTRAVENOUS | Status: DC

## 2021-07-28 MED ORDER — SODIUM CHLORIDE 0.9 % IV BOLUS
1000.0000 mL | Freq: Once | INTRAVENOUS | Status: AC
  Administered 2021-07-28: 1000 mL via INTRAVENOUS

## 2021-07-28 NOTE — ED Triage Notes (Signed)
Patient brought in via POV from urgent care. Patient states she woke up this morning with large amounts of diarrhea, nausea and vomiting. UC gave phenergan. Sent over here for dehydration  ?

## 2021-07-28 NOTE — Therapy (Incomplete)
?OUTPATIENT PHYSICAL THERAPY TREATMENT NOTE ? ?Patient Name: Candice Cruz ?MRN: [id number] ?DOB:February 06, 1996, 26 y.o., female ?Today's Date: 07/28/2021 ? ?PCP: London Pepper, MD ?REFERRING PROVIDER: Serafina Mitchell, MD ? ? ? ? ? ? ? ? ? ? ? ? ? ?Past Medical History:  ?Diagnosis Date  ? Anxiety   ? situational  ? Complication of anesthesia   ? Depression   ? situational  ? DVT (deep venous thrombosis) ([place]) 05/08/2021  ? right axillary and subclavian DVTs 05/08/21, s/p thrombectomy, venoplasty 05/09/21  ? PONV (postoperative nausea and vomiting) 2023  ? "a little nauseous"  ? ?Past Surgical History:  ?Procedure Laterality Date  ? APPENDECTOMY    ? CARDIAC CATHETERIZATION    ? INTRAVASCULAR ULTRASOUND/IVUS Right 05/09/2021  ? Procedure: Intravascular Ultrasound/IVUS;  Surgeon: Serafina Mitchell, MD;  Location: [place] [facility];  Service: Cardiovascular;  Laterality: Right;  upper extremity  ? LAPAROSCOPIC APPENDECTOMY N/A 05/19/2016  ? Procedure: APPENDECTOMY LAPAROSCOPIC;  Surgeon: Erroll Luna, MD;  Location: [place];  Service: General;  Laterality: N/A;  ? PERIPHERAL VASCULAR BALLOON ANGIOPLASTY Right 05/09/2021  ? Procedure: PERIPHERAL VASCULAR BALLOON ANGIOPLASTY;  Surgeon: Serafina Mitchell, MD;  Location: [place] [facility];  Service: Cardiovascular;  Laterality: Right;  subclavian vein  ? PERIPHERAL VASCULAR THROMBECTOMY Right 05/09/2021  ? Procedure: PERIPHERAL VASCULAR THROMBECTOMY;  Surgeon: Serafina Mitchell, MD;  Location: [place] [facility];  Service: Cardiovascular;  Laterality: Right;  subclavian vein  ? RIB RESECTION Right 06/02/2021  ? Procedure: RIGHT FIRST RIB RESECTION;  Surgeon: Serafina Mitchell, MD;  Location: [facility] OR;  Service: Vascular;  Laterality: Right;  ? ULTRASOUND GUIDANCE FOR VASCULAR ACCESS Right 06/02/2021  ? Procedure: ULTRASOUND GUIDANCE FOR VASCULAR ACCESS;  Surgeon: Serafina Mitchell, MD;  Location: [facility] OR;  Service: Vascular;  Laterality: Right;  ? ULTRASOUND GUIDANCE FOR VASCULAR ACCESS  Right 06/30/2021  ? Procedure: ULTRASOUND GUIDANCE FOR VASCULAR ACCESS, RIGHT BRACHIAL VEIN AND RIGHT FEMORAL VEIN;  Surgeon: Serafina Mitchell, MD;  [facility];  Service: Vascular;  Laterality: Right;  ? UPPER EXTREMITY VENOGRAPHY N/A 05/09/2021  ? Procedure: UPPER EXTREMITY VENOGRAPHY;  Surgeon: Serafina Mitchell, MD;  Location: [place] [facility];  Service: Cardiovascular;  Laterality: N/A;  ? VENOGRAM Right 06/02/2021  ? Procedure: VENOGRAM RIGHT UPPER EXTREMITY;  Surgeon: Serafina Mitchell, MD;  Location: [place];  Service: Vascular;  Laterality: Right;  ? VENOGRAM Right 06/30/2021  ? Procedure: VENOGRAM WITH VENOPLASTY;  Surgeon: Serafina Mitchell, MD;  Location: [place];  Service: Vascular;  Laterality: Right;  ? ?Patient Active Problem List  ? Diagnosis Date Noted  ? Venous thrombosis of upper extremity 06/02/2021  ? DVT (deep venous thrombosis) ([place]) 05/09/2021  ? Acute appendicitis 05/19/2016  ? ? ?REFERRING DIAG: R transaxillary first rib resection secondary to thoracic outlet syndrome. ? ?THERAPY DIAG:  ?No diagnosis found. ? ?PERTINENT HISTORY: RUE DVT 05/08/21 ? ?PRECAUTIONS: None ? ?SUBJECTIVE: *** Patient reports soreness following last visit, denies any pain. Exercises are going well. ? ?PAIN:  ?Are you having pain? No ?NPRS scale: ***/10 ?Pain location: N/A ?PAIN TYPE: N/A ?Pain description: N/A ?Aggravating factors: N/A ?Relieving factors: N/A ? ? ?OBJECTIVE:  ?*Unless otherwise noted all objective measures were captured on initial evaluation.  ?PATIENT SURVEYS:  ?FOTO 52% to 73% ?06/28/21: 72% function  ?07/11/21: 70% function  ?                 ?SENSATION: ?  Light touch: decreased sensation about posterior axilla ?          ?POSTURE: ?Forward head/rounded shoulders ?  ?PALPATION: ?Tautness and palpable tenderness Rt upper trap, levator scapulae, rhomboids ?  ?Cervical AROM:  ?WNL all planes; mild ache with cervical extension and Rt side bend along right posterior neck ?  ?UPPER EXTREMITY AROM/PROM:  Left shoulder AROM WNL  ?  ?A/PROM Right ?06/07/2021 Left ?06/07/2021 06/15/21 Right 06/22/21 Rt  07/11/21 ?Right AROM  ?Shoulder flexion 83   160  180  ?Shoulder extension         ?Shoulder abduction 110    180 180  ?Shoulder adduction         ?Shoulder internal rotation 90     90  ?Shoulder external rotation 40     70  ?Elbow flexion         ?Elbow extension         ?Wrist flexion         ?Wrist extension         ?Wrist ulnar deviation         ?Wrist radial deviation         ?Wrist pronation         ?Wrist supination         ? ?UPPER EXTREMITY MMT:  ?  ?MMT Right ?07/11/21 Left ?07/11/21 Rt / Lt ?07/22/2021  ?Shoulder flexion  4-/5 4+/5    ?Shoulder extension       ?Shoulder abduction 5/5  5/5    ?Shoulder adduction       ?Shoulder internal rotation  4/5  5/5   ?Shoulder external rotation  4/5 5/5    ?Middle trapezius  4-/5 4-/5  4- / 4-  ?Lower trapezius  3+/5 3+/5  4- / 4-  ?Elbow flexion  5  5   ?Elbow extension  5 5    ?Wrist flexion       ?Wrist extension       ?Wrist ulnar deviation       ?Wrist radial deviation       ?Wrist pronation       ?Wrist supination       ?Grip strength (lbs) 45 45   ? ?  ?TODAY'S TREATMENT:  ?[facility] Adult PT Treatment:                                                DATE: 07/29/2021 ?Therapeutic Exercise: ?UBE L1 x 4 min (2 fwd/bwd) ?Shoulder extension GTB 2 x 15 ?Row GTB 2 x 15 ?Supine scapular protraction/retraction on FR 2 x 10 ?Supine horizontal abduction with green on FR 2 x 10 ?Supine diagonals with green on FR 2 x 10 ?Cat cow x 10 ?Quadruped chin tuck x 10 ?Quadruped thoracic rotation with hand behind neck x 5 each ?Palloff press 7# cable 2 x 10 each ?Scaption with 2# 2 x 10 ?High plank on elevated table with alternating shoulder taps 2 x 10 ?Seated double ER and scap retraction with green 2 x 15 ?Manual Therapy: ?*** ?Neuromuscular re-ed: ?*** ?Therapeutic Activity: ?*** ?Modalities: ?*** ?Self Care: ?*** ? ? ?[place] Adult PT Treatment:  DATE:  07/25/2021 ?Therapeutic Exercise: ?UBE L1 x 5 min (2.5 fwd/bwd) ?Shoulder extension GTB 2 x 15 ?Row GTB 2 x 15 ?Supine scapular protraction/retraction on FR 2 x 10 ?Supine horizontal abduction with green on FR 2 x 10 ?Supine diagonals with green on FR 2 x 10 ?Palloff press 7# cable 2 x 10 each ?Manual Therapy: ?STM to bilateral upper traps to decrease tightness ? ? ? ?[facility] Adult PT Treatment:                                                DATE: 07/22/2021 ?Therapeutic Exercise: ?UBE L1 x 4 min (2 fwd/bwd) ?Shoulder extension GTB 2 x 15 ?Row GTB 2 x 15 ?Supine scapular protraction/retraction on FR 2 x 10 ?Supine horizontal abduction with green on FR 2 x 10 ?Supine diagonals with green on FR 2 x 10 ?Cat cow x 10 ?Quadruped chin tuck x 10 ?Quadruped thoracic rotation with hand behind neck x 5 each ?Palloff press 7# cable 2 x 10 each ?Scaption with 2# 2 x 10 ?High plank on elevated table with alternating shoulder taps 2 x 10 ?Seated double ER and scap retraction with green 2 x 15 ? ? ?  ?PATIENT EDUCATION: ?Education details: HEP ?Person educated: Patient ?Education method: Explanation ?Education comprehension: verbalized understanding  ?  ?HOME EXERCISE PROGRAM: ?Access Code: [id number] ?  ?  ?ASSESSMENT: ?CLINICAL IMPRESSION: ?*** ? ?Patient presents to therapy with no pain but tiredness and soreness form last session. Session today focused on postural strengthening and control. Patient was able to tolerate all prescribed exercises with no adverse effects. Patient continues to benefit from skilled PT services and should be progressed as able to improve functional independence. ? ?  ?  ?GOALS: ?Goals reviewed with patient? No ?  ?SHORT TERM GOALS: ?  ?STG Name Target Date Goal status  ?1 Patient will be independent and compliant with initial HEP.  ?  ?Baseline:  06/28/2021 Achieved   ?2 Patient will tolerate lying flat in supine to improve her overall sleep quality.  ?Baseline: must utilize wedge due to pain. 07/05/2021  Achieved   ?3 Patient will demonstrate at least 120 degrees of Rt shoulder flexion AROM to improve ability to reach into cabinet.  ?Baseline: 07/05/2021 Achieved   ?  ?LONG TERM GOALS:  ?  ?LTG Name Target Date Go

## 2021-07-28 NOTE — ED Provider Notes (Signed)
?[facility] EMERGENCY DEPT ?Provider Note ? ? ?CSN: [id number] ?Arrival date & time: 07/28/21  1653 ? ?  ? ?History ? ?Chief Complaint  ?Patient presents with  ? Diarrhea  ? ? ?Candice Cruz is a 26 y.o. female. ? ?Patient presents to ER chief complaint of nausea and diarrhea.  Symptoms ongoing for 1 day.  She states she ate some fast food yesterday and since then developed multiple episodes of diarrhea.  She went to urgent care, advised go to the ER for resuscitation.  Complaining of some nausea but states that she was given medicine at urgent care.  She was given a prescription of diarrhea medicine and vomiting medicine she states.  Otherwise denies fevers denies cough denies headache or chest pain.  Denies abdominal pain.  Diarrhea is described as watery nonbloody.  Family at bedside. ? ?At some's position last exam done at 730 with no guarding or tenderness benign exam.  I feel this is likely reactive leukocytosis.  I doubt acute appendicitis or other abdominal pathology given benign exams.  Patient states she feels much better after fluid resuscitation.  She tolerated Imodium.  Discharged home stable condition to continue medications written by urgent care.  Advised return for pain fevers inability keep down any fluids or any additional concerns. ?Patient advised immediate return for any pain fevers or inability to keep down any fluids.  Otherwise follow-up with the primary care doctor within the week.  Patient advised immediate return for worsening symptoms otherwise to follow-up with her primary care doctor this week.  Advised return if she has fevers worsening pain or cannot keep down any fluids.  Testing 1 2 testing ? ? ?  ? ?Home Medications ?Prior to Admission medications   ?Medication Sig Start Date End Date Taking? Authorizing Provider  ?diphenhydrAMINE (BENADRYL) 25 MG tablet Take 25 mg by mouth at bedtime.    [provider]  ?gabapentin (NEURONTIN) 400 MG capsule Take 400 mg  by mouth daily as needed (Back pain). 03/28/21   [provider]  ?Hyprom-Naphaz-Polysorb-Zn Sulf (CLEAR EYES COMPLETE) SOLN Place 1 drop into both eyes 2 (two) times daily as needed (dry, irritated, or itchy eyes).    [provider]  ?loratadine (CLARITIN) 10 MG tablet Take 1 tablet (10 mg total) by mouth daily. 06/19/21   Nada Libman, MD  ?Multiple Vitamins-Minerals (ONE-A-DAY WOMENS) tablet Take 1 tablet by mouth daily.    [provider]  ?rivaroxaban (XARELTO) 20 MG TABS tablet Take 1 tablet (20 mg total) by mouth daily with supper. 05/30/21   Lars Mage, PA-C  ?traMADol (ULTRAM) 50 MG tablet Take 2 tablets (100 mg total) by mouth every 6 (six) hours as needed for severe pain. 06/05/21 06/05/22  Graceann Congress, PA-C  ?   ? ?Allergies    ?Duloxetine and Amitriptyline   ? ?Review of Systems   ?Review of Systems  ?Constitutional:  Negative for fever.  ?HENT:  Negative for ear pain.   ?Eyes:  Negative for pain.  ?Respiratory:  Negative for cough.   ?Cardiovascular:  Negative for chest pain.  ?Gastrointestinal:  Positive for diarrhea. Negative for abdominal pain.  ?Genitourinary:  Negative for flank pain.  ?Musculoskeletal:  Negative for back pain.  ?Skin:  Negative for rash.  ?Neurological:  Negative for headaches.  ? ?Physical Exam ?Updated Vital Signs ?BP 101/65   Pulse 95   Temp 98.4 ?F (36.9 ?C)   Resp 20   Ht 5\' 5"  (1.651 m)   Wt  49.9 kg   SpO2 100%   BMI 18.30 kg/m?  ?Physical Exam ?Constitutional:   ?   General: She is not in acute distress. ?   Appearance: Normal appearance.  ?HENT:  ?   Head: Normocephalic.  ?   Nose: Nose normal.  ?Eyes:  ?   Extraocular Movements: Extraocular movements intact.  ?Cardiovascular:  ?   Rate and Rhythm: Normal rate.  ?Pulmonary:  ?   Effort: Pulmonary effort is normal.  ?Abdominal:  ?   Tenderness: There is no abdominal tenderness. There is no guarding or rebound.  ?Musculoskeletal:     ?   General: Normal range of motion.  ?    Cervical back: Normal range of motion.  ?Neurological:  ?   General: No focal deficit present.  ?   Mental Status: She is alert. Mental status is at baseline.  ? ? ?ED Results / Procedures / Treatments   ?Labs ?(all labs ordered are listed, but only abnormal results are displayed) ?Labs Reviewed  ?CBC WITH DIFFERENTIAL/PLATELET - Abnormal; Notable for the following components:  ?    Result Value  ? WBC 15.6 (*)   ? RBC 5.16 (*)   ? Hemoglobin 15.2 (*)   ? Neutro Abs 15.0 (*)   ? Lymphs Abs 0.3 (*)   ? All other components within normal limits  ?BASIC METABOLIC PANEL - Abnormal; Notable for the following components:  ? CO2 21 (*)   ? Glucose, Bld 122 (*)   ? All other components within normal limits  ?URINALYSIS, ROUTINE W REFLEX MICROSCOPIC - Abnormal; Notable for the following components:  ? Ketones, ur >80 (*)   ? Protein, ur 30 (*)   ? All other components within normal limits  ?MAGNESIUM  ?PREGNANCY, URINE  ? ? ?EKG ?None ? ?Radiology ?No results found. ? ?Procedures ?Procedures  ? ? ?Medications Ordered in ED ?Medications  ?sodium chloride 0.9 % bolus 1,000 mL (0 mLs Intravenous Stopped 07/28/21 1905)  ?loperamide (IMODIUM) capsule 4 mg (4 mg Oral Given 07/28/21 1905)  ? ? ?ED Course/ Medical Decision Making/ A&P ?  ?                        ?Medical Decision Making ?Amount and/or Complexity of Data Reviewed ?Labs: ordered. ? ?Risk ?Prescription drug management. ? ? ?Chart review shows primary care office visit earlier today urgent care. ? ?Patient on the monitor showing sinus rhythm. ? ?Family at bedside providing additional history as needed. ? ?Work-up included labs chemistry unremarkable white count elevated 15 urinalysis negative. ? ?Given elevated white, some did not for abdominal pathology.  However abdominal exam is benign.  Serial abdominal sounds are done, last exam done at 7 PM with no guarding or tenderness, benign exam. ? ?Patient advised immediate return for fevers, pain or inability to keep down any  fluids. ? ? ? ? ? ? ? ? ? ?Final Clinical Impression(s) / ED Diagnoses ?Final diagnoses:  ?Diarrhea, unspecified type  ? ? ?Rx / DC Orders ?ED Discharge Orders   ? ? None  ? ?  ? ? ?  ?Cheryll Cockayne, MD ?07/28/21 1939 ? ?

## 2021-07-28 NOTE — Discharge Instructions (Addendum)
Call your primary care doctor or specialist as discussed in the next 2-3 days.   Return immediately back to the ER if:  Your symptoms worsen within the next 12-24 hours. You develop new symptoms such as new fevers, persistent vomiting, new pain, shortness of breath, or new weakness or numbness, or if you have any other concerns.  

## 2021-07-28 NOTE — ED Notes (Signed)
Informed Josh - Medic of pt's orthostatic results. ?

## 2021-07-29 ENCOUNTER — Ambulatory Visit: Payer: Commercial Managed Care - HMO

## 2021-07-31 ENCOUNTER — Other Ambulatory Visit: Payer: Self-pay

## 2021-07-31 DIAGNOSIS — I871 Compression of vein: Secondary | ICD-10-CM

## 2021-07-31 NOTE — Addendum Note (Signed)
Addended byDoylene Bode on: 07/31/2021 05:00 PM ? ? Modules accepted: Orders ? ?

## 2021-08-01 ENCOUNTER — Ambulatory Visit: Payer: Commercial Managed Care - HMO | Attending: Surgery

## 2021-08-01 DIAGNOSIS — M25511 Pain in right shoulder: Secondary | ICD-10-CM | POA: Insufficient documentation

## 2021-08-01 DIAGNOSIS — M6281 Muscle weakness (generalized): Secondary | ICD-10-CM | POA: Diagnosis present

## 2021-08-01 DIAGNOSIS — R293 Abnormal posture: Secondary | ICD-10-CM | POA: Insufficient documentation

## 2021-08-01 NOTE — Therapy (Signed)
?OUTPATIENT PHYSICAL THERAPY TREATMENT NOTE ? ?Patient Name: Candice Cruz ?MRN: [id number] ?DOB:1996-01-19, 26 y.o., female ?Today's Date: 08/01/2021 ? ?PCP: Farris Has, MD ?REFERRING PROVIDER: Nada Libman, MD ? ? PT End of Session - 08/01/21 1829   ? ? Visit Number 12   ? Number of Visits 17   ? Date for PT Re-Evaluation 08/05/21   ? Authorization Type [employer]   ? PT Start Time 1830   ? PT Stop Time 1910   ? PT Time Calculation (min) 40 min   ? Activity Tolerance Patient tolerated treatment well   ? Behavior During Therapy [facility] for tasks assessed/performed   ? ?  ?  ? ?  ? ? ? ? ? ? ? ? ? ? ? ? ?Past Medical History:  ?Diagnosis Date  ? Anxiety   ? situational  ? Complication of anesthesia   ? Depression   ? situational  ? DVT (deep venous thrombosis) ([facility]) 05/08/2021  ? right axillary and subclavian DVTs 05/08/21, s/p thrombectomy, venoplasty 05/09/21  ? PONV (postoperative nausea and vomiting) 2023  ? "a little nauseous"  ? ?Past Surgical History:  ?Procedure Laterality Date  ? APPENDECTOMY    ? CARDIAC CATHETERIZATION    ? INTRAVASCULAR ULTRASOUND/IVUS Right 05/09/2021  ? Procedure: Intravascular Ultrasound/IVUS;  Surgeon: Nada Libman, MD;  [facility];  Service: Cardiovascular;  Laterality: Right;  upper extremity  ? LAPAROSCOPIC APPENDECTOMY N/A 05/19/2016  ? Procedure: APPENDECTOMY LAPAROSCOPIC;  Surgeon: Harriette Bouillon, MD;  Location: [facility] OR;  Service: General;  Laterality: N/A;  ? PERIPHERAL VASCULAR BALLOON ANGIOPLASTY Right 05/09/2021  ? Procedure: PERIPHERAL VASCULAR BALLOON ANGIOPLASTY;  Surgeon: Nada Libman, MD;  [facility]: [facility];  Service: Cardiovascular;  Laterality: Right;  subclavian vein  ? PERIPHERAL VASCULAR THROMBECTOMY Right 05/09/2021  ? Procedure: PERIPHERAL VASCULAR THROMBECTOMY;  Surgeon: Nada Libman, MD;  [facility];  Service: Cardiovascular;  Laterality: Right;  subclavian vein  ? RIB RESECTION Right 06/02/2021  ? Procedure:  RIGHT FIRST RIB RESECTION;  Surgeon: Nada Libman, MD;  Location: [facility] OR;  Service: Vascular;  Laterality: Right;  ? ULTRASOUND GUIDANCE FOR VASCULAR ACCESS Right 06/02/2021  ? Procedure: ULTRASOUND GUIDANCE FOR VASCULAR ACCESS;  Surgeon: Nada Libman, MD;  Location: [facility] OR;  Service: Vascular;  Laterality: Right;  ? ULTRASOUND GUIDANCE FOR VASCULAR ACCESS Right 06/30/2021  ? Procedure: ULTRASOUND GUIDANCE FOR VASCULAR ACCESS, RIGHT BRACHIAL VEIN AND RIGHT FEMORAL VEIN;  Surgeon: Nada Libman, MD;  Location: [facility];  Service: Vascular;  Laterality: Right;  ? UPPER EXTREMITY VENOGRAPHY N/A 05/09/2021  ? Procedure: UPPER EXTREMITY VENOGRAPHY;  Surgeon: Nada Libman, MD;  [facility];  Service: Cardiovascular;  Laterality: N/A;  ? VENOGRAM Right 06/02/2021  ? Procedure: VENOGRAM RIGHT UPPER EXTREMITY;  Surgeon: Nada Libman, MD;  Location: [facility] OR;  Service: Vascular;  Laterality: Right;  ? VENOGRAM Right 06/30/2021  ? Procedure: VENOGRAM WITH VENOPLASTY;  Surgeon: Nada Libman, MD;  Location: [facility] OR;  Service: Vascular;  Laterality: Right;  ? ?Patient Active Problem List  ? Diagnosis Date Noted  ? Venous thrombosis of upper extremity 06/02/2021  ? DVT (deep venous thrombosis) ([facility]) 05/09/2021  ? Acute appendicitis 05/19/2016  ? ? ?REFERRING DIAG: R transaxillary first rib resection secondary to thoracic outlet syndrome. ? ?THERAPY DIAG:  ?Acute pain of right shoulder ? ?Muscle weakness (generalized) ? ?Abnormal posture ? ?PERTINENT HISTORY: RUE DVT 05/08/21 ? ?PRECAUTIONS: None ? ?  SUBJECTIVE: Patient reports soreness following last visit, denies any pain. Exercises are going well. ? ?PAIN:  ?Are you having pain? No ?NPRS scale: 0/10 ?Pain location: N/A ?PAIN TYPE: N/A ?Pain description: N/A ?Aggravating factors: N/A ?Relieving factors: N/A ? ? ?OBJECTIVE:  ?*Unless otherwise noted all objective measures were captured on initial evaluation.  ?PATIENT SURVEYS:  ?FOTO 52% to 73% ?06/28/21: 72%  function  ?07/11/21: 70% function  ?                 ?SENSATION: ?         Light touch: decreased sensation about posterior axilla ?          ?POSTURE: ?Forward head/rounded shoulders ?  ?PALPATION: ?Tautness and palpable tenderness Rt upper trap, levator scapulae, rhomboids ?  ?Cervical AROM:  ?WNL all planes; mild ache with cervical extension and Rt side bend along right posterior neck ?  ?UPPER EXTREMITY AROM/PROM: Left shoulder AROM WNL  ?  ?A/PROM Right ?06/07/2021 Left ?06/07/2021 06/15/21 Right 06/22/21 Rt  07/11/21 ?Right AROM  ?Shoulder flexion 83   160  180  ?Shoulder extension         ?Shoulder abduction 110    180 180  ?Shoulder adduction         ?Shoulder internal rotation 90     90  ?Shoulder external rotation 40     70  ?Elbow flexion         ?Elbow extension         ?Wrist flexion         ?Wrist extension         ?Wrist ulnar deviation         ?Wrist radial deviation         ?Wrist pronation         ?Wrist supination         ? ?UPPER EXTREMITY MMT:  ?  ?MMT Right ?07/11/21 Left ?07/11/21 Rt / Lt ?07/22/2021  ?Shoulder flexion  4-/5 4+/5    ?Shoulder extension       ?Shoulder abduction 5/5  5/5    ?Shoulder adduction       ?Shoulder internal rotation  4/5  5/5   ?Shoulder external rotation  4/5 5/5    ?Middle trapezius  4-/5 4-/5  4- / 4-  ?Lower trapezius  3+/5 3+/5  4- / 4-  ?Elbow flexion  5  5   ?Elbow extension  5 5    ?Wrist flexion       ?Wrist extension       ?Wrist ulnar deviation       ?Wrist radial deviation       ?Wrist pronation       ?Wrist supination       ?Grip strength (lbs) 45 45   ? ?  ?TODAY'S TREATMENT:  ?[facility]                                                DATE: 08/01/2021 ?Therapeutic Exercise: ?UBE L1 x 5 min (2.5 fwd/bwd) ?Shoulder extension GTB 2 x 15 ?Row GTB 2 x 15 ?Supine scapular protraction/retraction on FR 2 x 10 ?Supine horizontal abduction with green on FR 2 x 10 ?Supine diagonals with green on FR 2 x 10 ?Palloff press 7# cable 5" hold x10 BIL ?Scaption with 2#  2 x  10 ?High plank on elevated table with alternating shoulder taps 2 x 10 BIL ? ? ?[facility] Adult PT Treatment:                                                DATE: 07/25/2021 ?Therapeutic Exercise: ?UBE L1 x 5 min (2.5 fwd/bwd) ?Shoulder extension GTB 2 x 15 ?Row GTB 2 x 15 ?Supine scapular protraction/retraction on FR 2 x 10 ?Supine horizontal abduction with green on FR 2 x 10 ?Supine diagonals with green on FR 2 x 10 ?Palloff press 7# cable 2 x 10 each ?Manual Therapy: ?STM to bilateral upper traps to decrease tightness ? ? ? ?[facility] Adult PT Treatment:                                                DATE: 07/22/2021 ?Therapeutic Exercise: ?UBE L1 x 4 min (2 fwd/bwd) ?Shoulder extension GTB 2 x 15 ?Row GTB 2 x 15 ?Supine scapular protraction/retraction on FR 2 x 10 ?Supine horizontal abduction with green on FR 2 x 10 ?Supine diagonals with green on FR 2 x 10 ?Cat cow x 10 ?Quadruped chin tuck x 10 ?Quadruped thoracic rotation with hand behind neck x 5 each ?Palloff press 7# cable 2 x 10 each ?Scaption with 2# 2 x 10 ?High plank on elevated table with alternating shoulder taps 2 x 10 ?Seated double ER and scap retraction with green 2 x 15 ? ? ?  ?PATIENT EDUCATION: ?Education details: HEP ?Person educated: Patient ?Education method: Explanation ?Education comprehension: verbalized understanding  ?  ?HOME EXERCISE PROGRAM: ?Access Code: [email or web address] ?  ?  ?ASSESSMENT: ?CLINICAL IMPRESSION: ?Patient presents to PT with no current pain but reports soreness after last session. Today's session focused on postural and periscapular strengthening. Patient was able to tolerate all prescribed exercises with no adverse effects. Patient continues to benefit from skilled PT services and should be progressed as able to improve functional independence. ?  ?  ?GOALS: ?Goals reviewed with patient? No ?  ?SHORT TERM GOALS: ?  ?STG Name Target Date Goal status  ?1 Patient will be independent and compliant with initial HEP.  ?  ?Baseline:   06/28/2021 Achieved   ?2 Patient will tolerate lying flat in supine to improve her overall sleep quality.  ?Baseline: must utilize wedge due to pain. 07/05/2021 Achieved   ?3 Patient will demonstrate at least 120 de

## 2021-08-11 NOTE — Therapy (Addendum)
?OUTPATIENT PHYSICAL THERAPY TREATMENT NOTE/ DISCHARGE SUMMARY ? ?Patient Name: Candice Cruz ?MRN: [id number] ?DOB:December 06, 1995, 26 y.o., female ?Today's Date: 08/12/2021 ? ?PCP: London Pepper, MD ?REFERRING PROVIDER: Serafina Mitchell, MD ? ? PT End of Session - 08/12/21 0815   ? ? Visit Number 13   ? Number of Visits 17   ? Date for PT Re-Evaluation 08/12/21   ? Authorization Type [employer]   ? PT Start Time 3748   ? PT Stop Time 2707   ? PT Time Calculation (min) 38 min   ? Activity Tolerance Patient tolerated treatment well   ? Behavior During Therapy [facility] for tasks assessed/performed   ? ?  ?  ? ?  ? ? ? ? ? ? ? ? ? ? ? ? ? ?Past Medical History:  ?Diagnosis Date  ? Anxiety   ? situational  ? Complication of anesthesia   ? Depression   ? situational  ? DVT (deep venous thrombosis) ([place]) 05/08/2021  ? right axillary and subclavian DVTs 05/08/21, s/p thrombectomy, venoplasty 05/09/21  ? PONV (postoperative nausea and vomiting) 2023  ? "a little nauseous"  ? ?Past Surgical History:  ?Procedure Laterality Date  ? APPENDECTOMY    ? CARDIAC CATHETERIZATION    ? INTRAVASCULAR ULTRASOUND/IVUS Right 05/09/2021  ? Procedure: Intravascular Ultrasound/IVUS;  Surgeon: Serafina Mitchell, MD;  Location: [place] [facility];  Service: Cardiovascular;  Laterality: Right;  upper extremity  ? LAPAROSCOPIC APPENDECTOMY N/A 05/19/2016  ? Procedure: APPENDECTOMY LAPAROSCOPIC;  Surgeon: Erroll Luna, MD;  Location: [place];  Service: General;  Laterality: N/A;  ? PERIPHERAL VASCULAR BALLOON ANGIOPLASTY Right 05/09/2021  ? Procedure: PERIPHERAL VASCULAR BALLOON ANGIOPLASTY;  Surgeon: Serafina Mitchell, MD;  Location: [place] [facility];  Service: Cardiovascular;  Laterality: Right;  subclavian vein  ? PERIPHERAL VASCULAR THROMBECTOMY Right 05/09/2021  ? Procedure: PERIPHERAL VASCULAR THROMBECTOMY;  Surgeon: Serafina Mitchell, MD;  Location: [place] [facility];  Service: Cardiovascular;  Laterality: Right;  subclavian vein  ? RIB RESECTION Right  06/02/2021  ? Procedure: RIGHT FIRST RIB RESECTION;  Surgeon: Serafina Mitchell, MD;  Location: [facility] OR;  Service: Vascular;  Laterality: Right;  ? ULTRASOUND GUIDANCE FOR VASCULAR ACCESS Right 06/02/2021  ? Procedure: ULTRASOUND GUIDANCE FOR VASCULAR ACCESS;  Surgeon: Serafina Mitchell, MD;  Location: [facility] OR;  Service: Vascular;  Laterality: Right;  ? ULTRASOUND GUIDANCE FOR VASCULAR ACCESS Right 06/30/2021  ? Procedure: ULTRASOUND GUIDANCE FOR VASCULAR ACCESS, RIGHT BRACHIAL VEIN AND RIGHT FEMORAL VEIN;  Surgeon: Serafina Mitchell, MD;  Location: [facility] OR;  Service: Vascular;  Laterality: Right;  ? UPPER EXTREMITY VENOGRAPHY N/A 05/09/2021  ? Procedure: UPPER EXTREMITY VENOGRAPHY;  Surgeon: Serafina Mitchell, MD;  Location: [place] [facility];  Service: Cardiovascular;  Laterality: N/A;  ? VENOGRAM Right 06/02/2021  ? Procedure: VENOGRAM RIGHT UPPER EXTREMITY;  Surgeon: Serafina Mitchell, MD;  Location: [place];  Service: Vascular;  Laterality: Right;  ? VENOGRAM Right 06/30/2021  ? Procedure: VENOGRAM WITH VENOPLASTY;  Surgeon: Serafina Mitchell, MD;  Location: [place];  Service: Vascular;  Laterality: Right;  ? ?Patient Active Problem List  ? Diagnosis Date Noted  ? Venous thrombosis of upper extremity 06/02/2021  ? DVT (deep venous thrombosis) ([place]) 05/09/2021  ? Acute appendicitis 05/19/2016  ? ? ?REFERRING DIAG: R transaxillary first rib resection secondary to thoracic outlet syndrome. ? ?THERAPY DIAG:  ?Acute pain of right shoulder ? ?Muscle weakness (generalized) ? ?Abnormal posture ? ?PERTINENT HISTORY: RUE DVT 05/08/21 ? ?  PRECAUTIONS: None ? ?SUBJECTIVE: Pt reports no pain today, adding that her HEP has been going well. She also reports that her ROM is satisfactory at this point. She is agreeable to discharge at this time. ? ?PAIN:  ?Are you having pain? No ?NPRS scale: 0/10 ?Pain location: N/A ?PAIN TYPE: N/A ?Pain description: N/A ?Aggravating factors: N/A ?Relieving factors: N/A ? ? ?OBJECTIVE:  ?*Unless otherwise noted all  objective measures were captured on initial evaluation.  ?PATIENT SURVEYS:  ?FOTO 52% to 73% ?06/28/21: 72% function  ?07/11/21: 70% function  ?08/12/2021: 83% function ?                 ?SENSATION: ?         Light touch: decreased sensation about posterior axilla ?          ?POSTURE: ?Forward head/rounded shoulders ?  ?PALPATION: ?Tautness and palpable tenderness Rt upper trap, levator scapulae, rhomboids ?  ?Cervical AROM:  ?WNL all planes; mild ache with cervical extension and Rt side bend along right posterior neck ?  ?UPPER EXTREMITY AROM/PROM: Left shoulder AROM WNL  ?  ?A/PROM Right ?06/07/2021 Left ?06/07/2021 06/15/21 Right 06/22/21 Rt  07/11/21 ?Right AROM  ?Shoulder flexion 83   160  180  ?Shoulder extension         ?Shoulder abduction 110    180 180  ?Shoulder adduction         ?Shoulder internal rotation 90     90  ?Shoulder external rotation 40     70  ?Elbow flexion         ?Elbow extension         ?Wrist flexion         ?Wrist extension         ?Wrist ulnar deviation         ?Wrist radial deviation         ?Wrist pronation         ?Wrist supination         ? ?UPPER EXTREMITY MMT:  ?  ?MMT Right ?07/11/21 Left ?07/11/21 Rt / Lt ?07/22/2021 Right ?08/12/2021 Left ?08/12/2021  ?Shoulder flexion  4-/5 4+/5   5/5 5/5  ?Shoulder extension         ?Shoulder abduction 5/5  5/5      ?Shoulder adduction         ?Shoulder internal rotation  4/5  5/5  5/5   ?Shoulder external rotation  4/5 5/5   5/5   ?Middle trapezius  4-/5 4-/5  4- / 4- 4+/5 4+/5  ?Lower trapezius  3+/5 3+/5  4- / 4- 4+/5 4+/5  ?Elbow flexion  5  5     ?Elbow extension  5 5      ?Wrist flexion         ?Wrist extension         ?Wrist ulnar deviation         ?Wrist radial deviation         ?Wrist pronation         ?Wrist supination         ?Grip strength (lbs) 45 45     ? ?  ?TODAY'S TREATMENT:  ? ?[facility] Adult PT Treatment:                                                  DATE: 08/12/2021 ?Therapeutic Exercise: ?GTB low rows 2x10 ?GTB shoulder extension 2x10 ?GTB  staggered lat pull-down 2x10 ?Standing scapular Y with YTB 2x10 ?Shoulder rolls 2x10 forward/ backward ?Step-up planks 2x10 ?Bent-over 3# dumbbell rear delt flies 2x10 BIL ?Knee push-up plus 2x10 ?Manual Therapy: ?N/A ?Neuromuscular re-ed: ?N/A ?Therapeutic Activity: ?Re-assessment of objective information with pt education ?Modalities: ?N/A ?Self Care: ?N/A ? ? ?[facility] Adult PT Treatment:                                                DATE: 08/01/2021 ?Therapeutic Exercise: ?UBE L1 x 5 min (2.5 fwd/bwd) ?Shoulder extension GTB 2 x 15 ?Row GTB 2 x 15 ?Supine scapular protraction/retraction on FR 2 x 10 ?Supine horizontal abduction with green on FR 2 x 10 ?Supine diagonals with green on FR 2 x 10 ?Palloff press 7# cable 5" hold x10 BIL ?Scaption with 2# 2 x 10 ?High plank on elevated table with alternating shoulder taps 2 x 10 BIL ? ? ?[facility] Adult PT Treatment:                                                DATE: 07/25/2021 ?Therapeutic Exercise: ?UBE L1 x 5 min (2.5 fwd/bwd) ?Shoulder extension GTB 2 x 15 ?Row GTB 2 x 15 ?Supine scapular protraction/retraction on FR 2 x 10 ?Supine horizontal abduction with green on FR 2 x 10 ?Supine diagonals with green on FR 2 x 10 ?Palloff press 7# cable 2 x 10 each ?Manual Therapy: ?STM to bilateral upper traps to decrease tightness ? ? ? ?  ?PATIENT EDUCATION: ?Education details: HEP ?Person educated: Patient ?Education method: Explanation ?Education comprehension: verbalized understanding  ?  ?HOME EXERCISE PROGRAM: ?Access Code: [email or web address] ?  ?  ?ASSESSMENT: ?CLINICAL IMPRESSION: ?Upon re-assessment, the pt has met all of her rehab goals. She demonstrated all new exercises well, and her HEP was updated. She is discharged from PT at this time. ?  ?  ?GOALS: ?Goals reviewed with patient? No ?  ?SHORT TERM GOALS: ?  ?STG Name Target Date Goal status  ?1 Patient will be independent and compliant with initial HEP.  ?  ?Baseline:  06/28/2021 Achieved   ?2 Patient will tolerate lying flat in  supine to improve her overall sleep quality.  ?Baseline: must utilize wedge due to pain. 07/05/2021 Achieved   ?3 Patient will demonstrate at least 120 degrees of Rt shoulder flexion AROM to improve ability to re

## 2021-08-12 ENCOUNTER — Ambulatory Visit: Payer: Commercial Managed Care - HMO

## 2021-08-12 DIAGNOSIS — R293 Abnormal posture: Secondary | ICD-10-CM

## 2021-08-12 DIAGNOSIS — M25511 Pain in right shoulder: Secondary | ICD-10-CM | POA: Diagnosis not present

## 2021-08-12 DIAGNOSIS — M6281 Muscle weakness (generalized): Secondary | ICD-10-CM

## 2021-08-12 NOTE — Addendum Note (Signed)
Addended by: Sigmund Hazel on: 08/12/2021 10:43 AM ? ? Modules accepted: Orders ? ?

## 2021-08-14 ENCOUNTER — Ambulatory Visit (HOSPITAL_COMMUNITY)
Admission: RE | Admit: 2021-08-14 | Discharge: 2021-08-14 | Disposition: A | Discharge status: Home / Self Care | Payer: Managed Care, Other (non HMO) | Source: Ambulatory Visit | Attending: Vascular Surgery | Admitting: Vascular Surgery

## 2021-08-14 ENCOUNTER — Encounter: Payer: Self-pay | Admitting: Physician Assistant

## 2021-08-14 ENCOUNTER — Ambulatory Visit (INDEPENDENT_AMBULATORY_CARE_PROVIDER_SITE_OTHER): Payer: Managed Care, Other (non HMO) | Admitting: Physician Assistant

## 2021-08-14 VITALS — BP 114/79 | HR 76 | Temp 97.8°F | Resp 14 | Ht 65.0 in | Wt 174.0 lb

## 2021-08-14 DIAGNOSIS — I871 Compression of vein: Secondary | ICD-10-CM | POA: Insufficient documentation

## 2021-08-14 NOTE — Progress Notes (Signed)
[facility] [place] ?HISTORY AND PHYSICAL  ? ?History of Present Illness:  Patient is a 26 y.o. year old female who presents for evaluation of right UE.  She first presented with right UE swelling and found to have a DVT.  She then underwent mechanical thrombectomy of her axillary subclavian and brachiocephalic veins with subsequent balloon venoplasty.  This was felt to be secondary to thoracic outlet. ? She then underwent right first rib resection by Dr. Myra Gianotti and Dr. Randie Heinz.   She returned to the OR for the third time on 06/30/21 with Dr. Myra Gianotti who performed Balloon venoplasty of the right axillary, subclavian, and right innominate vein.  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 ? She has been on Xarelto for 3 months total starting 06/30/21.  She has had good improvement in the right UE edema.  There maybe a little residual edema she states.  She denies loss of motor or sensation in the right UE. ? ?Past Medical History:  ?Diagnosis Date  ? Anxiety   ? situational  ? Complication of anesthesia   ? Depression   ? situational  ? DVT (deep venous thrombosis) ([facility]) 05/08/2021  ? right axillary and subclavian DVTs 05/08/21, s/p thrombectomy, venoplasty 05/09/21  ? PONV (postoperative nausea and vomiting) 2023  ? "a little nauseous"  ? ? ?Past Surgical History:  ?Procedure Laterality Date  ? APPENDECTOMY    ? CARDIAC CATHETERIZATION    ? INTRAVASCULAR ULTRASOUND/IVUS Right 05/09/2021  ? Procedure: Intravascular Ultrasound/IVUS;  Surgeon: Nada Libman, MD;  [facility]: [facility];  Service: Cardiovascular;  Laterality: Right;  upper extremity  ? LAPAROSCOPIC APPENDECTOMY N/A 05/19/2016  ? Procedure: APPENDECTOMY LAPAROSCOPIC;  Surgeon: Harriette Bouillon, MD;  Location: [facility] OR;  Service: General;  Laterality: N/A;  ? PERIPHERAL VASCULAR BALLOON ANGIOPLASTY Right 05/09/2021  ? Procedure: PERIPHERAL VASCULAR BALLOON ANGIOPLASTY;  Surgeon: Nada Libman, MD;  [facility];  Service: Cardiovascular;  Laterality: Right;  subclavian vein  ? PERIPHERAL VASCULAR THROMBECTOMY Right 05/09/2021  ? Procedure: PERIPHERAL VASCULAR THROMBECTOMY;  Surgeon: Nada Libman, MD;  [facility];  Service: Cardiovascular;  Laterality: Right;  subclavian vein  ? RIB RESECTION Right 06/02/2021  ? Procedure: RIGHT FIRST RIB RESECTION;  Surgeon: Nada Libman, MD;  Location: [facility] OR;  Service: Vascular;  Laterality: Right;  ? ULTRASOUND GUIDANCE FOR VASCULAR ACCESS Right 06/02/2021  ? Procedure: ULTRASOUND GUIDANCE FOR VASCULAR ACCESS;  Surgeon: Nada Libman, MD;  Location: [facility];  Service: Vascular;  Laterality: Right;  ? ULTRASOUND GUIDANCE FOR VASCULAR ACCESS Right 06/30/2021  ? Procedure: ULTRASOUND GUIDANCE FOR VASCULAR ACCESS, RIGHT BRACHIAL VEIN AND RIGHT FEMORAL VEIN;  Surgeon: Nada Libman, MD;  Location: [facility] OR;  Service: Vascular;  Laterality: Right;  ? UPPER EXTREMITY VENOGRAPHY N/A 05/09/2021  ? Procedure: UPPER EXTREMITY VENOGRAPHY;  Surgeon: Nada Libman, MD;  [facility];  Service: Cardiovascular;  Laterality: N/A;  ? VENOGRAM Right 06/02/2021  ? Procedure: VENOGRAM RIGHT UPPER EXTREMITY;  Surgeon: Nada Libman, MD;  Location: [facility] OR;  Service: Vascular;  Laterality: Right;  ? VENOGRAM Right 06/30/2021  ? Procedure: VENOGRAM WITH VENOPLASTY;  Surgeon: Nada Libman, MD;  Location: [facility] OR;  Service: Vascular;  Laterality: Right;  ? ? ?ROS:  ? ?General:  No weight loss, Fever, chills ? ?HEENT: No recent headaches, no nasal bleeding, no visual changes, no sore throat ? ?Neurologic: No  dizziness, blackouts, seizures. No recent symptoms of stroke or mini- stroke. No recent episodes of slurred speech, or temporary blindness. ? ?Cardiac: No recent episodes of chest pain/pressure, no shortness of breath at rest.  No shortness of breath with exertion.  Denies history of atrial fibrillation or irregular heartbeat ? ?Vascular: No history of  rest pain in feet.  No history of claudication.  No history of non-healing ulcer, No history of DVT  ? ?Pulmonary: No home oxygen, no productive cough, no hemoptysis,  No asthma or wheezing ? ?Musculoskeletal:  [ ]  Arthritis, [ ]  Low back pain,  [ ]  Joint pain ? ?Hematologic:No history of hypercoagulable state.  No history of easy bleeding.  No history of anemia ? ?Gastrointestinal: No hematochezia or melena,  No gastroesophageal reflux, no trouble swallowing ? ?Urinary: [ ]  chronic Kidney disease, [ ]  on HD - [ ]  MWF or [ ]  TTHS, [ ]  Burning with urination, [ ]  Frequent urination, [ ]  Difficulty urinating;  ? ?Skin: No rashes ? ?Psychological: No history of anxiety,  No history of depression ? ?Social History ?Social History  ? ?Tobacco Use  ? Smoking status: Never  ? Smokeless tobacco: Never  ?Vaping Use  ? Vaping Use: Never used  ?Substance Use Topics  ? Alcohol use: No  ? Drug use: No  ? ? ?Family History ?History reviewed. No pertinent family history. ? ?Allergies ? ?Allergies  ?Allergen Reactions  ? Duloxetine Anaphylaxis  ? Amitriptyline Other (See Comments)  ?  Confusion  ? ? ? ?Current Outpatient Medications  ?Medication Sig Dispense Refill  ? gabapentin (NEURONTIN) 400 MG capsule Take 400 mg by mouth daily as needed (Back pain).    ? Multiple Vitamins-Minerals (ONE-A-DAY WOMENS) tablet Take 1 tablet by mouth daily.    ? rivaroxaban (XARELTO) 20 MG TABS tablet Take 1 tablet (20 mg total) by mouth daily with supper. 30 tablet 11  ? diphenhydrAMINE (BENADRYL) 25 MG tablet Take 25 mg by mouth at bedtime. (Patient not taking: Reported on 08/14/2021)    ? Hyprom-Naphaz-Polysorb-Zn Sulf (CLEAR EYES COMPLETE) SOLN Place 1 drop into both eyes 2 (two) times daily as needed (dry, irritated, or itchy eyes). (Patient not taking: Reported on 08/14/2021)    ? loratadine (CLARITIN) 10 MG tablet Take 1 tablet (10 mg total) by mouth daily. (Patient not taking: Reported on 08/14/2021) 30 tablet 1  ? traMADol (ULTRAM) 50 MG  tablet Take 2 tablets (100 mg total) by mouth every 6 (six) hours as needed for severe pain. (Patient not taking: Reported on 08/14/2021) 20 tablet 0  ? ?Current Facility-Administered Medications  ?Medication Dose Route Frequency Provider Last Rate Last Admin  ? nystatin cream (MYCOSTATIN)   Topical BID , MD      ? ? ?Physical Examination ? ?Vitals:  ? 08/14/21 1345  ?BP: 114/79  ?Pulse: 76  ?Resp: 14  ?Temp: 97.8 ?F (36.6 ?C)  ?TempSrc: Temporal  ?SpO2: 100%  ?Weight: 174 lb (78.9 kg)  ?Height: 5\' 5"  (1.651 m)  ? ? ?Body mass index is 28.96 kg/m?. ? ?General:  Alert and oriented, no acute distress ?HEENT: Normal ?Neck: No bruit or JVD ?Pulmonary: Clear to auscultation bilaterally ?Cardiac: Regular Rate and Rhythm without murmur ?Abdomen: Soft, non-tender, non-distended, no mass, no scars ?Skin: No rash ?Extremity Pulses:   radial pulses bilaterally palpable ?Musculoskeletal: No deformity or very minimal right UE edema  ?Neurologic: Upper and lower extremity motor 5/5 and symmetric ? ?DATA:  ?   ?Right Findings:  ?+----------+------------+---------+-----------+----------+---------------+  ?  RIGHT     CompressiblePhasicitySpontaneousProperties    Summary      ?+----------+------------+---------+-----------+----------+---------------+  ?IJV           Full       Yes       Yes                               ?+----------+------------+---------+-----------+----------+---------------+  ?Subclavian               No        No                                ?+----------+------------+---------+-----------+----------+---------------+  ?Axillary      Full       No        No               continuous flow  ?+----------+------------+---------+-----------+----------+---------------+  ?Brachial      Full       Yes       Yes                               ?+----------+------------+---------+-----------+----------+---------------+  ?Radial        Full       Yes       Yes                                ?+----------+------------+---------+-----------+----------+---------------+  ?Ulnar         Full       No        No                                ?+----------+------------+---------+-----------+----------+---------------+  ?Cephalic

## 2021-08-15 ENCOUNTER — Ambulatory Visit: Payer: Commercial Managed Care - HMO

## 2021-08-18 ENCOUNTER — Other Ambulatory Visit: Payer: Self-pay | Admitting: *Deleted

## 2021-08-18 DIAGNOSIS — I871 Compression of vein: Secondary | ICD-10-CM

## 2022-04-02 ENCOUNTER — Encounter: Payer: Self-pay | Admitting: Physician Assistant

## 2022-04-02 ENCOUNTER — Ambulatory Visit (HOSPITAL_COMMUNITY)
Admission: RE | Admit: 2022-04-02 | Discharge: 2022-04-02 | Disposition: A | Discharge status: Home / Self Care | Payer: Commercial Managed Care - HMO | Source: Ambulatory Visit | Attending: Surgery | Admitting: Surgery

## 2022-04-02 ENCOUNTER — Ambulatory Visit: Payer: Commercial Managed Care - HMO | Admitting: Physician Assistant

## 2022-04-02 VITALS — BP 106/74 | HR 80 | Temp 98.1°F | Resp 16 | Ht 66.0 in | Wt 101.0 lb

## 2022-04-02 DIAGNOSIS — I871 Compression of vein: Secondary | ICD-10-CM | POA: Insufficient documentation

## 2022-04-02 DIAGNOSIS — Z9889 Other specified postprocedural states: Secondary | ICD-10-CM | POA: Diagnosis not present

## 2022-04-02 NOTE — Progress Notes (Unsigned)
Office Note     CC:  follow up Requesting Provider:  Farris Has, MD  HPI: Candice Cruz is a 26 y.o. ([birth year]) female who presents for routine follow up of DVT. She has history of right upper extremity DVT for which she underwent mechanical thrombectomy of her axillary, subclavian and brachiocephalic veins with balloon venoplasty by Dr. Myra Gianotti on 05/09/21. Her DVT was felt to be a result of thoracic outlet syndrome. She subsequently underwent right first rib resection by Dr. Myra Gianotti and Dr. Randie Heinz on 06/02/21. This was to reduce risk of recurrent thrombosis. At time of rib resection she had repeated venogram which showed re occlusion of her central venous system. Dr. Myra Gianotti attempted to cross the occlusion at the time but was unsuccessful. He subsequently arranged for her to return for another attempt. On 06/30/21 she underwent right arm central venogram with balloon venoplasty or her right axillary, subclavian and right innominate vein. She had successful resolution of stenosis following this intervention.   At time of her last visit she was doing well. Her duplex indicated some thrombus in her subclavian vein but no new acute DVT. She was placed on Xarelto following her 06/30/21 intervention and completed a 3 month course. Today she reports no pain or swelling in her right arm. She does still occasionally get some discoloration in her forearm and hand and intermittently some darkening of nailbeds on her 4th and 5th fingers of right hand. She mostly notices this when working as she does a lot of typing for work. She is excited to finally be able to take a trip to [place] to visit family, as this was suppose to happen almost 1 year ago when she presented with DVT.   Past Medical History:  Diagnosis Date   Anxiety    situational   Complication of anesthesia    Depression    situational   DVT (deep venous thrombosis) (HCC) 05/08/2021   right axillary and subclavian DVTs 05/08/21, s/p  thrombectomy, venoplasty 05/09/21   PONV (postoperative nausea and vomiting) 2023   "a little nauseous"    Past Surgical History:  Procedure Laterality Date   APPENDECTOMY     CARDIAC CATHETERIZATION     INTRAVASCULAR ULTRASOUND/IVUS Right 05/09/2021   Procedure: Intravascular Ultrasound/IVUS;  Surgeon: Nada Libman, MD;  [facility]: [facility];  Service: Cardiovascular;  Laterality: Right;  upper extremity   LAPAROSCOPIC APPENDECTOMY N/A 05/19/2016   Procedure: APPENDECTOMY LAPAROSCOPIC;  Surgeon: Harriette Bouillon, MD;  Location: [facility];  Service: General;  Laterality: N/A;   PERIPHERAL VASCULAR BALLOON ANGIOPLASTY Right 05/09/2021   Procedure: PERIPHERAL VASCULAR BALLOON ANGIOPLASTY;  Surgeon: Nada Libman, MD;  [facility]: [facility];  Service: Cardiovascular;  Laterality: Right;  subclavian vein   PERIPHERAL VASCULAR THROMBECTOMY Right 05/09/2021   Procedure: PERIPHERAL VASCULAR THROMBECTOMY;  Surgeon: Nada Libman, MD;  [facility]: [facility];  Service: Cardiovascular;  Laterality: Right;  subclavian vein   RIB RESECTION Right 06/02/2021   Procedure: RIGHT FIRST RIB RESECTION;  Surgeon: Nada Libman, MD;  Location: [facility];  Service: Vascular;  Laterality: Right;   ULTRASOUND GUIDANCE FOR VASCULAR ACCESS Right 06/02/2021   Procedure: ULTRASOUND GUIDANCE FOR VASCULAR ACCESS;  Surgeon: Nada Libman, MD;  [facility]: [facility];  Service: Vascular;  Laterality: Right;   ULTRASOUND GUIDANCE FOR VASCULAR ACCESS Right 06/30/2021   Procedure: ULTRASOUND GUIDANCE FOR VASCULAR ACCESS, RIGHT BRACHIAL VEIN AND RIGHT FEMORAL VEIN;  Surgeon: Nada Libman, MD;  Location: [facility]  OR;  Service: Vascular;  Laterality: Right;   UPPER EXTREMITY VENOGRAPHY N/A 05/09/2021   Procedure: UPPER EXTREMITY VENOGRAPHY;  Surgeon: Serafina Mitchell, MD;  Location: [facility];  Service: Cardiovascular;  Laterality: N/A;   VENOGRAM Right 06/02/2021   Procedure: VENOGRAM RIGHT UPPER EXTREMITY;   Surgeon: Serafina Mitchell, MD;  Location: [facility] OR;  Service: Vascular;  Laterality: Right;   VENOGRAM Right 06/30/2021   Procedure: VENOGRAM WITH VENOPLASTY;  Surgeon: Serafina Mitchell, MD;  Location: [facility];  Service: Vascular;  Laterality: Right;    Social History   Socioeconomic History   Marital status: Single    Spouse name: Not on file   Number of children: Not on file   Years of education: Not on file   Highest education level: Not on file  Occupational History   Not on file  Tobacco Use   Smoking status: Never   Smokeless tobacco: Never  Vaping Use   Vaping Use: Never used  Substance and Sexual Activity   Alcohol use: No   Drug use: No   Sexual activity: Not on file  Other Topics Concern   Not on file  Social History Narrative   Not on file   Social Determinants of Health   Financial Resource Strain: Not on file  Food Insecurity: Not on file  Transportation Needs: Not on file  Physical Activity: Not on file  Stress: Not on file  Social Connections: Not on file  Intimate Partner Violence: Not on file   History reviewed. No pertinent family history.  Current Outpatient Medications  Medication Sig Dispense Refill   gabapentin (NEURONTIN) 400 MG capsule Take 400 mg by mouth daily as needed (Back pain).     Multiple Vitamins-Minerals (ONE-A-DAY WOMENS) tablet Take 1 tablet by mouth daily.     diphenhydrAMINE (BENADRYL) 25 MG tablet Take 25 mg by mouth at bedtime. (Patient not taking: Reported on 08/14/2021)     Hyprom-Naphaz-Polysorb-Zn Sulf (CLEAR EYES COMPLETE) SOLN Place 1 drop into both eyes 2 (two) times daily as needed (dry, irritated, or itchy eyes). (Patient not taking: Reported on 08/14/2021)     loratadine (CLARITIN) 10 MG tablet Take 1 tablet (10 mg total) by mouth daily. (Patient not taking: Reported on 08/14/2021) 30 tablet 1   rivaroxaban (XARELTO) 20 MG TABS tablet Take 1 tablet (20 mg total) by mouth daily with supper. (Patient not taking: Reported on  04/02/2022) 30 tablet 11   traMADol (ULTRAM) 50 MG tablet Take 2 tablets (100 mg total) by mouth every 6 (six) hours as needed for severe pain. (Patient not taking: Reported on 08/14/2021) 20 tablet 0   Current Facility-Administered Medications  Medication Dose Route Frequency Provider Last Rate Last Admin   nystatin cream (MYCOSTATIN)   Topical BID Serafina Mitchell, MD        Allergies  Allergen Reactions   Duloxetine Anaphylaxis   Amitriptyline Other (See Comments)    Confusion     REVIEW OF SYSTEMS:  [X]  denotes positive finding, [ ]  denotes negative finding Cardiac  Comments:  Chest pain or chest pressure:    Shortness of breath upon exertion:    Short of breath when lying flat:    Irregular heart rhythm:        Vascular    Pain in calf, thigh, or hip brought on by ambulation:    Pain in feet at night that wakes you up from your sleep:     Blood clot in your veins:  Leg swelling:         Pulmonary    Oxygen at home:    Productive cough:     Wheezing:         Neurologic    Sudden weakness in arms or legs:     Sudden numbness in arms or legs:     Sudden onset of difficulty speaking or slurred speech:    Temporary loss of vision in one eye:     Problems with dizziness:         Gastrointestinal    Blood in stool:     Vomited blood:         Genitourinary    Burning when urinating:     Blood in urine:        Psychiatric    Major depression:         Hematologic    Bleeding problems:    Problems with blood clotting too easily:        Skin    Rashes or ulcers:        Constitutional    Fever or chills:      PHYSICAL EXAMINATION:  Vitals:   04/02/22 1449  BP: 106/74  Pulse: 80  Resp: 16  Temp: 98.1 F (36.7 C)  TempSrc: Temporal  SpO2: 100%  Weight: 101 lb (45.8 kg)  Height: 5\' 6"  (1.676 m)    General:  WDWN in NAD; vital signs documented above Gait: Normal HENT: WNL, normocephalic Pulmonary: normal non-labored breathing Cardiac: regular HR,  without  Murmurs; right 1st rib resection incision is well healed Vascular Exam/Pulses:2+ radial pulses bilaterally, 2+ right brachial pulse Extremities: without ischemic changes, without Gangrene , without cellulitis; without open wounds; right upper extremity without any edema. Motor and sensation intact. Right hand warm and well perfused. 5/5 grip strength Musculoskeletal: no muscle wasting or atrophy  Neurologic: A&O X 3;  No focal weakness or paresthesias are detected Psychiatric:  The pt has Normal affect.   Non-Invasive Vascular Imaging:   Summary:  Right: No evidence of acute DVT. Unable to adquately visualize the subclavian vein.  Continuous flow in the subclavian, axillary, cephalic and basilic veins.  Brachial vein demonstrates some phasicity with deep inhalation. 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     ASSESSMENT/PLAN:: 26 y.o. female here for follow up for RUE DVT. She has history of right upper extremity DVT for which she underwent mechanical thrombectomy of her axillary, subclavian and brachiocephalic veins with balloon venoplasty by Dr. 26 on 05/09/21. Her DVT was felt to be a result of thoracic outlet syndrome. She subsequently underwent right first rib resection by Dr. 07/07/21 and Dr. Myra Gianotti on 06/02/21. Following this she had reocclusion of her central veins and so she later on 06/30/21 she underwent right arm central venogram with balloon venoplasty or her right axillary, subclavian and right innominate vein. Her duplex today remains stable. 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 This appeared on her prior duplex. Presently is not causing any symptoms. She completed 3 months of anticoagulation. Her RUE swelling has resolved. She has healed well following her rib resection. She understands should she develop any acute pain or significant swelling in her RUE to call for earlier follow up. We will plan to see her again in 1 year with repeat  ultrasound.   08/30/21, PA-C [facility] [phone]  Clinic MD:   [phone]

## 2022-04-03 ENCOUNTER — Encounter: Payer: Self-pay | Admitting: Physician Assistant

## 2022-05-27 DIAGNOSIS — U071 COVID-19: Secondary | ICD-10-CM | POA: Diagnosis not present

## 2022-05-27 DIAGNOSIS — B349 Viral infection, unspecified: Secondary | ICD-10-CM | POA: Diagnosis not present

## 2023-02-04 DIAGNOSIS — Z23 Encounter for immunization: Secondary | ICD-10-CM | POA: Diagnosis not present

## 2023-02-04 DIAGNOSIS — Z0000 Encounter for general adult medical examination without abnormal findings: Secondary | ICD-10-CM | POA: Diagnosis not present

## 2023-02-04 DIAGNOSIS — Z13220 Encounter for screening for lipoid disorders: Secondary | ICD-10-CM | POA: Diagnosis not present

## 2023-02-04 DIAGNOSIS — G540 Brachial plexus disorders: Secondary | ICD-10-CM | POA: Diagnosis not present

## 2023-03-25 ENCOUNTER — Other Ambulatory Visit: Payer: Self-pay | Admitting: *Deleted

## 2023-03-25 DIAGNOSIS — I871 Compression of vein: Secondary | ICD-10-CM

## 2023-04-08 ENCOUNTER — Ambulatory Visit (INDEPENDENT_AMBULATORY_CARE_PROVIDER_SITE_OTHER): Payer: BC Managed Care – PPO | Admitting: Physician Assistant

## 2023-04-08 ENCOUNTER — Ambulatory Visit (HOSPITAL_COMMUNITY)
Admission: RE | Admit: 2023-04-08 | Discharge: 2023-04-08 | Disposition: A | Discharge status: Home / Self Care | Payer: BC Managed Care – PPO | Source: Ambulatory Visit | Attending: Surgery | Admitting: Surgery

## 2023-04-08 VITALS — BP 104/70 | HR 68 | Temp 98.7°F | Ht 66.0 in | Wt 105.6 lb

## 2023-04-08 DIAGNOSIS — I871 Compression of vein: Secondary | ICD-10-CM | POA: Insufficient documentation

## 2023-04-08 NOTE — Progress Notes (Unsigned)
Office Note  History of Present Illness   Candice Cruz is a 27 y.o. (April 25, 1996) female who presents for  Past Medical History:  Diagnosis Date   Anxiety    situational   Complication of anesthesia    Depression    situational   DVT (deep venous thrombosis) (HCC) 05/08/2021   right axillary and subclavian DVTs 05/08/21, s/p thrombectomy, venoplasty 05/09/21   Peripheral vascular disease (HCC)    PONV (postoperative nausea and vomiting) 2023   "a little nauseous"    Past Surgical History:  Procedure Laterality Date   APPENDECTOMY     CARDIAC CATHETERIZATION     CORONARY ULTRASOUND/IVUS Right 05/09/2021   Procedure: Intravascular Ultrasound/IVUS;  Surgeon: Nada Libman, MD;  [facility]: [facility];  Service: Cardiovascular;  Laterality: Right;  upper extremity   LAPAROSCOPIC APPENDECTOMY N/A 05/19/2016   Procedure: APPENDECTOMY LAPAROSCOPIC;  Surgeon: Harriette Bouillon, MD;  Location: [facility];  Service: General;  Laterality: N/A;   PERIPHERAL VASCULAR BALLOON ANGIOPLASTY Right 05/09/2021   Procedure: PERIPHERAL VASCULAR BALLOON ANGIOPLASTY;  Surgeon: Nada Libman, MD;  [facility]: [facility];  Service: Cardiovascular;  Laterality: Right;  subclavian vein   PERIPHERAL VASCULAR THROMBECTOMY Right 05/09/2021   Procedure: PERIPHERAL VASCULAR THROMBECTOMY;  Surgeon: Nada Libman, MD;  [facility];  Service: Cardiovascular;  Laterality: Right;  subclavian vein   RIB RESECTION Right 06/02/2021   Procedure: RIGHT FIRST RIB RESECTION;  Surgeon: Nada Libman, MD;  [facility]: [facility];  Service: Vascular;  Laterality: Right;   ULTRASOUND GUIDANCE FOR VASCULAR ACCESS Right 06/02/2021   Procedure: ULTRASOUND GUIDANCE FOR VASCULAR ACCESS;  Surgeon: Nada Libman, MD;  [facility]: [facility];  Service: Vascular;  Laterality: Right;   ULTRASOUND GUIDANCE FOR VASCULAR ACCESS Right 06/30/2021   Procedure: ULTRASOUND GUIDANCE FOR VASCULAR ACCESS, RIGHT BRACHIAL  VEIN AND RIGHT FEMORAL VEIN;  Surgeon: Nada Libman, MD;  Location: [facility];  Service: Vascular;  Laterality: Right;   UPPER EXTREMITY VENOGRAPHY N/A 05/09/2021   Procedure: UPPER EXTREMITY VENOGRAPHY;  Surgeon: Nada Libman, MD;  [facility];  Service: Cardiovascular;  Laterality: N/A;   VENOGRAM Right 06/02/2021   Procedure: VENOGRAM RIGHT UPPER EXTREMITY;  Surgeon: Nada Libman, MD;  Location: [facility] OR;  Service: Vascular;  Laterality: Right;   VENOGRAM Right 06/30/2021   Procedure: VENOGRAM WITH VENOPLASTY;  Surgeon: Nada Libman, MD;  Location: [facility];  Service: Vascular;  Laterality: Right;    Social History   Socioeconomic History   Marital status: Single    Spouse name: Not on file   Number of children: Not on file   Years of education: Not on file   Highest education level: Not on file  Occupational History   Not on file  Tobacco Use   Smoking status: Never   Smokeless tobacco: Never  Vaping Use   Vaping status: Never Used  Substance and Sexual Activity   Alcohol use: No   Drug use: No   Sexual activity: Not on file  Other Topics Concern   Not on file  Social History Narrative   Not on file   Social Determinants of Health   Financial Resource Strain: Not on file  Food Insecurity: Not on file  Transportation Needs: Not on file  Physical Activity: Not on file  Stress: Not on file  Social Connections: Not on file  Intimate Partner Violence: Not  on file   ***History reviewed. No pertinent family history.  Current Outpatient Medications  Medication Sig Dispense Refill   gabapentin (NEURONTIN) 400 MG capsule Take 400 mg by mouth daily as needed (Back pain).     Multiple Vitamins-Minerals (ONE-A-DAY WOMENS) tablet Take 1 tablet by mouth daily.     diphenhydrAMINE (BENADRYL) 25 MG tablet Take 25 mg by mouth at bedtime. (Patient not taking: Reported on 08/14/2021)     Hyprom-Naphaz-Polysorb-Zn Sulf (CLEAR EYES COMPLETE) SOLN Place 1 drop into  both eyes 2 (two) times daily as needed (dry, irritated, or itchy eyes). (Patient not taking: Reported on 08/14/2021)     loratadine (CLARITIN) 10 MG tablet Take 1 tablet (10 mg total) by mouth daily. (Patient not taking: Reported on 08/14/2021) 30 tablet 1   rivaroxaban (XARELTO) 20 MG TABS tablet Take 1 tablet (20 mg total) by mouth daily with supper. (Patient not taking: Reported on 04/02/2022) 30 tablet 11   Current Facility-Administered Medications  Medication Dose Route Frequency Provider Last Rate Last Admin   nystatin cream (MYCOSTATIN)   Topical BID Nada Libman, MD        Allergies  Allergen Reactions   Duloxetine Anaphylaxis   Amitriptyline Other (See Comments)    Confusion    ***REVIEW OF SYSTEMS (negative unless checked):   Cardiac:  []  Chest pain or chest pressure? []  Shortness of breath upon activity? []  Shortness of breath when lying flat? []  Irregular heart rhythm?  Vascular:  []  Pain in calf, thigh, or hip brought on by walking? []  Pain in feet at night that wakes you up from your sleep? []  Blood clot in your veins? []  Leg swelling?  Pulmonary:  []  Oxygen at home? []  Productive cough? []  Wheezing?  Neurologic:  []  Sudden weakness in arms or legs? []  Sudden numbness in arms or legs? []  Sudden onset of difficult speaking or slurred speech? []  Temporary loss of vision in one eye? []  Problems with dizziness?  Gastrointestinal:  []  Blood in stool? []  Vomited blood?  Genitourinary:  []  Burning when urinating? []  Blood in urine?  Psychiatric:  []  Major depression  Hematologic:  []  Bleeding problems? []  Problems with blood clotting?  Dermatologic:  []  Rashes or ulcers?  Constitutional:  []  Fever or chills?  Ear/Nose/Throat:  []  Change in hearing? []  Nose bleeds? []  Sore throat?  Musculoskeletal:  []  Back pain? []  Joint pain? []  Muscle pain?   Physical Examination     Vitals:   04/08/23 1010  BP: 104/70  Pulse: 68  Temp: 98.7  F (37.1 C)  TempSrc: Temporal  SpO2: 98%  Weight: 105 lb 9.6 oz (47.9 kg)  Height: 5\' 6"  (1.676 m)   Body mass index is 17.04 kg/m.  General:  WDWN in NAD; vital signs documented above Gait: Not observed HENT: WNL, normocephalic Pulmonary: normal non-labored breathing , without Rales, rhonchi,  wheezing Cardiac: {Desc; regular/irreg:14544} HR, without  Murmurs {With/Without:20273} carotid bruit*** Abdomen: soft, NT, no masses Skin: {With/Without:20273} rashes Vascular Exam/Pulses:  Right Left  Radial {Exam; arterial pulse strength 0-4:30167} {Exam; arterial pulse strength 0-4:30167}  Ulnar {Exam; arterial pulse strength 0-4:30167} {Exam; arterial pulse strength 0-4:30167}  Femoral {Exam; arterial pulse strength 0-4:30167} {Exam; arterial pulse strength 0-4:30167}  Popliteal {Exam; arterial pulse strength 0-4:30167} {Exam; arterial pulse strength 0-4:30167}  DP {Exam; arterial pulse strength 0-4:30167} {Exam; arterial pulse strength 0-4:30167}  PT {Exam; arterial pulse strength 0-4:30167} {Exam; arterial pulse strength 0-4:30167}   Extremities: {With/Without:20273} varicose veins, {With/Without:20273} reticular veins, {With/Without:20273} edema, {  With/Without:20273} stasis pigmentation, {With/Without:20273} lipodermatosclerosis, {With/Without:20273} ulcers Musculoskeletal: no muscle wasting or atrophy  Neurologic: A&O X 3;  No focal weakness or paresthesias are detected Psychiatric:  The pt has {Desc; normal/abnormal:11317::"Normal"} affect.  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 Findings for deep vein thrombosis  appear unchanged from previous study.   Medical Decision Making   Kimbrely Tori is a 27 y.o. female who presents with: ***LE chronic venous insufficiency, ***varicose veins with complications  Based on the  patient's history and examination, I recommend: ***. I discussed with the patient the use of her 20-30 mm thigh high compression stockings and need for 3 month trial of such. The patient will follow up in 3 months with Dr. Marland Kitchen Thank you for allowing [place] to participate in this patient's care.   Cortavius Montesinos Sharin Mons, PA-C [facility] [place] Office: [phone]  04/08/2023, 10:18 AM  Clinic MD: ***

## 2023-04-11 ENCOUNTER — Other Ambulatory Visit: Payer: Self-pay

## 2023-04-11 DIAGNOSIS — I871 Compression of vein: Secondary | ICD-10-CM

## 2023-11-25 DIAGNOSIS — Z01419 Encounter for gynecological examination (general) (routine) without abnormal findings: Secondary | ICD-10-CM | POA: Diagnosis not present

## 2023-11-25 DIAGNOSIS — N9089 Other specified noninflammatory disorders of vulva and perineum: Secondary | ICD-10-CM | POA: Diagnosis not present

## 2023-11-25 DIAGNOSIS — Z130 Encounter for screening for diseases of the blood and blood-forming organs and certain disorders involving the immune mechanism: Secondary | ICD-10-CM | POA: Diagnosis not present

## 2024-02-18 DIAGNOSIS — Z3202 Encounter for pregnancy test, result negative: Secondary | ICD-10-CM | POA: Diagnosis not present

## 2024-02-18 DIAGNOSIS — R300 Dysuria: Secondary | ICD-10-CM | POA: Diagnosis not present

## 2024-02-18 DIAGNOSIS — R350 Frequency of micturition: Secondary | ICD-10-CM | POA: Diagnosis not present

## 2024-02-24 DIAGNOSIS — Z0000 Encounter for general adult medical examination without abnormal findings: Secondary | ICD-10-CM | POA: Diagnosis not present

## 2024-02-24 DIAGNOSIS — Z23 Encounter for immunization: Secondary | ICD-10-CM | POA: Diagnosis not present

## 2024-02-24 DIAGNOSIS — G540 Brachial plexus disorders: Secondary | ICD-10-CM | POA: Diagnosis not present

## 2024-02-24 DIAGNOSIS — R636 Underweight: Secondary | ICD-10-CM | POA: Diagnosis not present

## 2024-02-24 DIAGNOSIS — Z13220 Encounter for screening for lipoid disorders: Secondary | ICD-10-CM | POA: Diagnosis not present

## 2024-02-24 DIAGNOSIS — E559 Vitamin D deficiency, unspecified: Secondary | ICD-10-CM | POA: Diagnosis not present

## 2024-03-01 DIAGNOSIS — S90852A Superficial foreign body, left foot, initial encounter: Secondary | ICD-10-CM | POA: Diagnosis not present

## 2024-03-01 DIAGNOSIS — L089 Local infection of the skin and subcutaneous tissue, unspecified: Secondary | ICD-10-CM | POA: Diagnosis not present

## 2024-03-01 DIAGNOSIS — X58XXXA Exposure to other specified factors, initial encounter: Secondary | ICD-10-CM | POA: Diagnosis not present
